# Patient Record
Sex: Female | Born: 1935 | Race: White | Hispanic: No | Marital: Married | Smoking: Never smoker
Health system: Southern US, Community
[De-identification: ages and names within clinical notes are randomized; demographics above are authoritative.]

## PROBLEM LIST (undated history)

## (undated) DIAGNOSIS — C259 Malignant neoplasm of pancreas, unspecified: Secondary | ICD-10-CM

---

## 2005-07-03 ENCOUNTER — Ambulatory Visit: Payer: Self-pay | Admitting: Family Medicine

## 2005-08-23 ENCOUNTER — Ambulatory Visit: Payer: Self-pay | Admitting: Surgery

## 2005-12-03 ENCOUNTER — Ambulatory Visit: Payer: Self-pay | Admitting: Unknown Physician Specialty

## 2006-07-16 ENCOUNTER — Ambulatory Visit: Payer: Self-pay | Admitting: Family Medicine

## 2007-07-23 ENCOUNTER — Ambulatory Visit: Payer: Self-pay | Admitting: Family Medicine

## 2009-10-11 ENCOUNTER — Ambulatory Visit: Payer: Self-pay | Admitting: Ophthalmology

## 2009-11-30 ENCOUNTER — Ambulatory Visit: Payer: Self-pay | Admitting: Ophthalmology

## 2010-01-18 ENCOUNTER — Inpatient Hospital Stay: Payer: Self-pay | Admitting: Internal Medicine

## 2010-01-20 ENCOUNTER — Encounter: Payer: Self-pay | Admitting: Gastroenterology

## 2010-01-24 ENCOUNTER — Telehealth (INDEPENDENT_AMBULATORY_CARE_PROVIDER_SITE_OTHER): Payer: Self-pay | Admitting: *Deleted

## 2010-01-24 ENCOUNTER — Encounter (INDEPENDENT_AMBULATORY_CARE_PROVIDER_SITE_OTHER): Payer: Self-pay | Admitting: *Deleted

## 2010-01-24 DIAGNOSIS — R933 Abnormal findings on diagnostic imaging of other parts of digestive tract: Secondary | ICD-10-CM

## 2010-01-25 ENCOUNTER — Telehealth: Payer: Self-pay | Admitting: Gastroenterology

## 2010-01-26 ENCOUNTER — Ambulatory Visit: Payer: Self-pay | Admitting: Gastroenterology

## 2010-01-26 ENCOUNTER — Ambulatory Visit (HOSPITAL_COMMUNITY): Admission: RE | Admit: 2010-01-26 | Discharge: 2010-01-26 | Payer: Self-pay | Admitting: Gastroenterology

## 2010-02-01 ENCOUNTER — Telehealth: Payer: Self-pay | Admitting: Gastroenterology

## 2010-02-01 ENCOUNTER — Telehealth (INDEPENDENT_AMBULATORY_CARE_PROVIDER_SITE_OTHER): Payer: Self-pay | Admitting: *Deleted

## 2010-02-01 ENCOUNTER — Encounter (INDEPENDENT_AMBULATORY_CARE_PROVIDER_SITE_OTHER): Payer: Self-pay | Admitting: *Deleted

## 2010-02-01 ENCOUNTER — Ambulatory Visit: Payer: Self-pay | Admitting: Gastroenterology

## 2010-02-06 ENCOUNTER — Encounter: Payer: Self-pay | Admitting: Gastroenterology

## 2010-02-06 ENCOUNTER — Ambulatory Visit (HOSPITAL_COMMUNITY): Admission: RE | Admit: 2010-02-06 | Discharge: 2010-02-06 | Payer: Self-pay | Admitting: Gastroenterology

## 2010-02-08 ENCOUNTER — Telehealth (INDEPENDENT_AMBULATORY_CARE_PROVIDER_SITE_OTHER): Payer: Self-pay | Admitting: *Deleted

## 2010-02-17 ENCOUNTER — Inpatient Hospital Stay (HOSPITAL_COMMUNITY): Admission: EM | Admit: 2010-02-17 | Discharge: 2010-02-22 | Payer: Self-pay | Admitting: Emergency Medicine

## 2010-02-17 ENCOUNTER — Encounter (INDEPENDENT_AMBULATORY_CARE_PROVIDER_SITE_OTHER): Payer: Self-pay | Admitting: *Deleted

## 2010-02-17 ENCOUNTER — Telehealth: Payer: Self-pay | Admitting: Gastroenterology

## 2010-02-18 ENCOUNTER — Encounter (INDEPENDENT_AMBULATORY_CARE_PROVIDER_SITE_OTHER): Payer: Self-pay | Admitting: *Deleted

## 2010-02-20 ENCOUNTER — Ambulatory Visit: Payer: Self-pay | Admitting: Internal Medicine

## 2010-02-21 ENCOUNTER — Ambulatory Visit: Payer: Self-pay | Admitting: Infectious Diseases

## 2010-02-24 ENCOUNTER — Ambulatory Visit: Payer: Self-pay | Admitting: Internal Medicine

## 2010-02-24 ENCOUNTER — Encounter: Payer: Self-pay | Admitting: Gastroenterology

## 2010-03-02 ENCOUNTER — Ambulatory Visit (HOSPITAL_COMMUNITY): Admission: RE | Admit: 2010-03-02 | Discharge: 2010-03-02 | Payer: Self-pay | Admitting: General Surgery

## 2010-03-05 ENCOUNTER — Ambulatory Visit: Payer: Self-pay | Admitting: Family Medicine

## 2010-03-15 ENCOUNTER — Ambulatory Visit (HOSPITAL_COMMUNITY): Admission: RE | Admit: 2010-03-15 | Discharge: 2010-03-15 | Payer: Self-pay | Admitting: General Surgery

## 2010-03-28 ENCOUNTER — Ambulatory Visit: Payer: Self-pay | Admitting: Gastroenterology

## 2010-03-29 ENCOUNTER — Encounter (INDEPENDENT_AMBULATORY_CARE_PROVIDER_SITE_OTHER): Payer: Self-pay | Admitting: *Deleted

## 2010-03-29 ENCOUNTER — Encounter: Payer: Self-pay | Admitting: Gastroenterology

## 2010-03-29 ENCOUNTER — Telehealth: Payer: Self-pay | Admitting: Gastroenterology

## 2010-03-29 DIAGNOSIS — Z862 Personal history of diseases of the blood and blood-forming organs and certain disorders involving the immune mechanism: Secondary | ICD-10-CM

## 2010-03-29 DIAGNOSIS — Z8639 Personal history of other endocrine, nutritional and metabolic disease: Secondary | ICD-10-CM

## 2010-03-30 ENCOUNTER — Ambulatory Visit (HOSPITAL_COMMUNITY): Admission: RE | Admit: 2010-03-30 | Discharge: 2010-03-30 | Payer: Self-pay | Admitting: Gastroenterology

## 2010-03-30 ENCOUNTER — Ambulatory Visit: Payer: Self-pay | Admitting: Gastroenterology

## 2010-04-03 ENCOUNTER — Encounter: Payer: Self-pay | Admitting: Gastroenterology

## 2010-04-03 ENCOUNTER — Encounter: Payer: Self-pay | Admitting: Nurse Practitioner

## 2010-04-04 ENCOUNTER — Telehealth: Payer: Self-pay | Admitting: Gastroenterology

## 2010-04-07 ENCOUNTER — Encounter: Payer: Self-pay | Admitting: Gastroenterology

## 2010-04-07 ENCOUNTER — Telehealth: Payer: Self-pay | Admitting: Gastroenterology

## 2010-04-27 ENCOUNTER — Encounter: Payer: Self-pay | Admitting: Gastroenterology

## 2010-05-16 ENCOUNTER — Ambulatory Visit: Payer: Self-pay | Admitting: Oncology

## 2010-05-16 LAB — CBC WITH DIFFERENTIAL (CANCER CENTER ONLY)
BASO#: 0 10*3/uL (ref 0.0–0.2)
Eosinophils Absolute: 0.3 10*3/uL (ref 0.0–0.5)
HCT: 31.4 % — ABNORMAL LOW (ref 34.8–46.6)
HGB: 10.9 g/dL — ABNORMAL LOW (ref 11.6–15.9)
LYMPH#: 2.2 10*3/uL (ref 0.9–3.3)
MONO#: 0.5 10*3/uL (ref 0.1–0.9)
NEUT#: 3.6 10*3/uL (ref 1.5–6.5)
NEUT%: 54.8 % (ref 39.6–80.0)
RBC: 3.5 10*6/uL — ABNORMAL LOW (ref 3.70–5.32)

## 2010-05-16 LAB — CMP (CANCER CENTER ONLY)
AST: 25 U/L (ref 11–38)
Albumin: 3.4 g/dL (ref 3.3–5.5)
BUN, Bld: 11 mg/dL (ref 7–22)
CO2: 29 mEq/L (ref 18–33)
Calcium: 9.1 mg/dL (ref 8.0–10.3)
Chloride: 100 mEq/L (ref 98–108)
Glucose, Bld: 102 mg/dL (ref 73–118)
Potassium: 3.9 mEq/L (ref 3.3–4.7)

## 2010-05-16 LAB — CANCER ANTIGEN 19-9: CA 19-9: 25.1 U/mL (ref <35.0)

## 2010-05-17 ENCOUNTER — Ambulatory Visit: Admission: RE | Admit: 2010-05-17 | Discharge: 2010-08-04 | Payer: Self-pay | Admitting: Radiation Oncology

## 2010-05-22 ENCOUNTER — Ambulatory Visit: Payer: Self-pay | Admitting: Radiation Oncology

## 2010-05-24 LAB — CBC WITH DIFFERENTIAL (CANCER CENTER ONLY)
BASO%: 0.2 % (ref 0.0–2.0)
LYMPH%: 29.2 % (ref 14.0–48.0)
MCV: 89 fL (ref 81–101)
MONO#: 0.1 10*3/uL (ref 0.1–0.9)
MONO%: 2.1 % (ref 0.0–13.0)
NEUT#: 3.7 10*3/uL (ref 1.5–6.5)
Platelets: 193 10*3/uL (ref 145–400)
RDW: 12.1 % (ref 10.5–14.6)
WBC: 5.6 10*3/uL (ref 3.9–10.0)

## 2010-05-24 LAB — CMP (CANCER CENTER ONLY)
ALT(SGPT): 24 U/L (ref 10–47)
Alkaline Phosphatase: 110 U/L — ABNORMAL HIGH (ref 26–84)
CO2: 32 mEq/L (ref 18–33)
Sodium: 144 mEq/L (ref 128–145)
Total Bilirubin: 0.6 mg/dl (ref 0.20–1.60)
Total Protein: 6.4 g/dL (ref 6.4–8.1)

## 2010-05-25 ENCOUNTER — Ambulatory Visit: Payer: Self-pay | Admitting: Internal Medicine

## 2010-06-01 LAB — CBC WITH DIFFERENTIAL (CANCER CENTER ONLY)
BASO%: 0.4 % (ref 0.0–2.0)
LYMPH%: 32.2 % (ref 14.0–48.0)
MCH: 30.2 pg (ref 26.0–34.0)
MCV: 89 fL (ref 81–101)
MONO%: 10.3 % (ref 0.0–13.0)
Platelets: 81 10*3/uL — ABNORMAL LOW (ref 145–400)
RDW: 12.7 % (ref 10.5–14.6)

## 2010-06-01 LAB — CMP (CANCER CENTER ONLY)
ALT(SGPT): 28 U/L (ref 10–47)
Alkaline Phosphatase: 117 U/L — ABNORMAL HIGH (ref 26–84)
Sodium: 143 mEq/L (ref 128–145)
Total Bilirubin: 0.5 mg/dl (ref 0.20–1.60)
Total Protein: 6.6 g/dL (ref 6.4–8.1)

## 2010-06-06 LAB — CBC WITH DIFFERENTIAL (CANCER CENTER ONLY)
Eosinophils Absolute: 0.1 10*3/uL (ref 0.0–0.5)
LYMPH%: 35.4 % (ref 14.0–48.0)
MCH: 30.5 pg (ref 26.0–34.0)
MCV: 89 fL (ref 81–101)
MONO%: 8.2 % (ref 0.0–13.0)
NEUT%: 53.8 % (ref 39.6–80.0)
Platelets: 299 10*3/uL (ref 145–400)
RBC: 3.33 10*6/uL — ABNORMAL LOW (ref 3.70–5.32)
RDW: 12.7 % (ref 10.5–14.6)

## 2010-06-06 LAB — CMP (CANCER CENTER ONLY)
Alkaline Phosphatase: 102 U/L — ABNORMAL HIGH (ref 26–84)
BUN, Bld: 8 mg/dL (ref 7–22)
Creat: 0.6 mg/dl (ref 0.6–1.2)
Glucose, Bld: 108 mg/dL (ref 73–118)
Sodium: 140 mEq/L (ref 128–145)
Total Bilirubin: 0.5 mg/dl (ref 0.20–1.60)
Total Protein: 6.3 g/dL — ABNORMAL LOW (ref 6.4–8.1)

## 2010-06-13 LAB — BASIC METABOLIC PANEL - CANCER CENTER ONLY
BUN, Bld: 13 mg/dL (ref 7–22)
CO2: 29 mEq/L (ref 18–33)
Calcium: 8.7 mg/dL (ref 8.0–10.3)
Creat: 0.7 mg/dl (ref 0.6–1.2)
Glucose, Bld: 224 mg/dL — ABNORMAL HIGH (ref 73–118)

## 2010-06-13 LAB — MANUAL DIFFERENTIAL (CHCC SATELLITE)
BASO: 1 % (ref 0–2)
Eos: 11 % — ABNORMAL HIGH (ref 0–7)
MONO: 9 % (ref 0–13)
PLT EST ~~LOC~~: INCREASED

## 2010-06-13 LAB — CBC WITH DIFFERENTIAL (CANCER CENTER ONLY)
HCT: 30.9 % — ABNORMAL LOW (ref 34.8–46.6)
HGB: 10.5 g/dL — ABNORMAL LOW (ref 11.6–15.9)
MCH: 30.7 pg (ref 26.0–34.0)
MCV: 91 fL (ref 81–101)
RBC: 3.41 10*6/uL — ABNORMAL LOW (ref 3.70–5.32)

## 2010-06-14 ENCOUNTER — Ambulatory Visit: Payer: Self-pay | Admitting: Oncology

## 2010-06-20 LAB — CBC WITH DIFFERENTIAL (CANCER CENTER ONLY)
BASO%: 0.8 % (ref 0.0–2.0)
LYMPH#: 0.7 10*3/uL — ABNORMAL LOW (ref 0.9–3.3)
LYMPH%: 9 % — ABNORMAL LOW (ref 14.0–48.0)
MCV: 92 fL (ref 81–101)
MONO#: 1 10*3/uL — ABNORMAL HIGH (ref 0.1–0.9)
Platelets: 203 10*3/uL (ref 145–400)
RDW: 13.8 % (ref 10.5–14.6)
WBC: 8 10*3/uL (ref 3.9–10.0)

## 2010-06-20 LAB — BASIC METABOLIC PANEL - CANCER CENTER ONLY
CO2: 30 mEq/L (ref 18–33)
Calcium: 9 mg/dL (ref 8.0–10.3)
Sodium: 138 mEq/L (ref 128–145)

## 2010-06-27 LAB — CBC WITH DIFFERENTIAL (CANCER CENTER ONLY)
BASO#: 0 10*3/uL (ref 0.0–0.2)
Eosinophils Absolute: 0.3 10*3/uL (ref 0.0–0.5)
HCT: 31.1 % — ABNORMAL LOW (ref 34.8–46.6)
HGB: 10.5 g/dL — ABNORMAL LOW (ref 11.6–15.9)
LYMPH#: 0.6 10*3/uL — ABNORMAL LOW (ref 0.9–3.3)
MCHC: 33.9 g/dL (ref 32.0–36.0)
MONO#: 0.6 10*3/uL (ref 0.1–0.9)
NEUT%: 68.7 % (ref 39.6–80.0)

## 2010-06-27 LAB — CMP (CANCER CENTER ONLY)
BUN, Bld: 8 mg/dL (ref 7–22)
CO2: 32 mEq/L (ref 18–33)
Calcium: 9.1 mg/dL (ref 8.0–10.3)
Chloride: 99 mEq/L (ref 98–108)
Creat: 0.6 mg/dl (ref 0.6–1.2)
Glucose, Bld: 245 mg/dL — ABNORMAL HIGH (ref 73–118)

## 2010-07-04 LAB — CMP (CANCER CENTER ONLY)
ALT(SGPT): 111 U/L — ABNORMAL HIGH (ref 10–47)
Albumin: 3 g/dL — ABNORMAL LOW (ref 3.3–5.5)
BUN, Bld: 10 mg/dL (ref 7–22)
CO2: 29 mEq/L (ref 18–33)
Calcium: 8.8 mg/dL (ref 8.0–10.3)
Chloride: 95 mEq/L — ABNORMAL LOW (ref 98–108)
Creat: 0.6 mg/dl (ref 0.6–1.2)
Potassium: 3.8 mEq/L (ref 3.3–4.7)

## 2010-07-04 LAB — CBC WITH DIFFERENTIAL (CANCER CENTER ONLY)
BASO#: 0 10*3/uL (ref 0.0–0.2)
BASO%: 0.3 % (ref 0.0–2.0)
HCT: 31.6 % — ABNORMAL LOW (ref 34.8–46.6)
HGB: 10.6 g/dL — ABNORMAL LOW (ref 11.6–15.9)
LYMPH#: 0.3 10*3/uL — ABNORMAL LOW (ref 0.9–3.3)
MONO#: 0.4 10*3/uL (ref 0.1–0.9)
NEUT#: 8.5 10*3/uL — ABNORMAL HIGH (ref 1.5–6.5)
NEUT%: 91 % — ABNORMAL HIGH (ref 39.6–80.0)
WBC: 9.3 10*3/uL (ref 3.9–10.0)

## 2010-07-05 ENCOUNTER — Ambulatory Visit (HOSPITAL_COMMUNITY): Admission: RE | Admit: 2010-07-05 | Discharge: 2010-07-05 | Payer: Self-pay | Admitting: Oncology

## 2010-07-05 LAB — GAMMA GT: GGT: 284 U/L — ABNORMAL HIGH (ref 7–51)

## 2010-07-07 ENCOUNTER — Ambulatory Visit (HOSPITAL_COMMUNITY): Admission: RE | Admit: 2010-07-07 | Discharge: 2010-07-07 | Payer: Self-pay | Admitting: Oncology

## 2010-07-07 ENCOUNTER — Encounter: Payer: Self-pay | Admitting: Gastroenterology

## 2010-07-07 LAB — CBC WITH DIFFERENTIAL (CANCER CENTER ONLY)
BASO%: 0.3 % (ref 0.0–2.0)
EOS%: 8.4 % — ABNORMAL HIGH (ref 0.0–7.0)
HCT: 34.7 % — ABNORMAL LOW (ref 34.8–46.6)
LYMPH%: 7.3 % — ABNORMAL LOW (ref 14.0–48.0)
MCHC: 33.1 g/dL (ref 32.0–36.0)
MCV: 95 fL (ref 81–101)
MONO#: 0.4 10*3/uL (ref 0.1–0.9)
MONO%: 7.1 % (ref 0.0–13.0)
NEUT%: 76.9 % (ref 39.6–80.0)
Platelets: 264 10*3/uL (ref 145–400)
RDW: 16.3 % — ABNORMAL HIGH (ref 10.5–14.6)
WBC: 5.6 10*3/uL (ref 3.9–10.0)

## 2010-07-07 LAB — CMP (CANCER CENTER ONLY)
ALT(SGPT): 97 U/L — ABNORMAL HIGH (ref 10–47)
AST: 99 U/L — ABNORMAL HIGH (ref 11–38)
Alkaline Phosphatase: 598 U/L — ABNORMAL HIGH (ref 26–84)
Creat: 0.4 mg/dl — ABNORMAL LOW (ref 0.6–1.2)
Sodium: 141 mEq/L (ref 128–145)
Total Bilirubin: 0.7 mg/dl (ref 0.20–1.60)
Total Protein: 6.6 g/dL (ref 6.4–8.1)

## 2010-07-07 LAB — HEPATITIS B SURFACE ANTIBODY,QUALITATIVE: Hep B S Ab: NEGATIVE

## 2010-07-07 LAB — LIPID PANEL
LDL Cholesterol: 82 mg/dL (ref 0–99)
Total CHOL/HDL Ratio: 3.2 Ratio
VLDL: 12 mg/dL (ref 0–40)

## 2010-07-07 LAB — HEPATITIS B SURFACE ANTIGEN: Hepatitis B Surface Ag: NEGATIVE

## 2010-07-11 ENCOUNTER — Encounter: Payer: Self-pay | Admitting: Gastroenterology

## 2010-07-11 ENCOUNTER — Telehealth: Payer: Self-pay | Admitting: Gastroenterology

## 2010-07-11 LAB — CBC WITH DIFFERENTIAL (CANCER CENTER ONLY)
BASO#: 0 10*3/uL (ref 0.0–0.2)
EOS%: 7 % (ref 0.0–7.0)
Eosinophils Absolute: 0.3 10*3/uL (ref 0.0–0.5)
LYMPH%: 11.7 % — ABNORMAL LOW (ref 14.0–48.0)
MCH: 32.2 pg (ref 26.0–34.0)
MCHC: 34.5 g/dL (ref 32.0–36.0)
MCV: 93 fL (ref 81–101)
MONO%: 8.7 % (ref 0.0–13.0)
NEUT#: 2.9 10*3/uL (ref 1.5–6.5)
Platelets: 213 10*3/uL (ref 145–400)
RBC: 3.31 10*6/uL — ABNORMAL LOW (ref 3.70–5.32)

## 2010-07-11 LAB — CMP (CANCER CENTER ONLY)
AST: 133 U/L — ABNORMAL HIGH (ref 11–38)
Alkaline Phosphatase: 584 U/L — ABNORMAL HIGH (ref 26–84)
BUN, Bld: 8 mg/dL (ref 7–22)
Glucose, Bld: 193 mg/dL — ABNORMAL HIGH (ref 73–118)
Potassium: 3.3 mEq/L (ref 3.3–4.7)
Sodium: 139 mEq/L (ref 128–145)
Total Bilirubin: 0.6 mg/dl (ref 0.20–1.60)

## 2010-07-12 LAB — HEPATITIS A ANTIBODY, IGM: Hep A IgM: NEGATIVE

## 2010-07-12 LAB — ANA: Anti Nuclear Antibody(ANA): NEGATIVE

## 2010-07-13 ENCOUNTER — Telehealth (INDEPENDENT_AMBULATORY_CARE_PROVIDER_SITE_OTHER): Payer: Self-pay | Admitting: *Deleted

## 2010-07-17 ENCOUNTER — Ambulatory Visit: Payer: Self-pay | Admitting: Gastroenterology

## 2010-07-17 LAB — CONVERTED CEMR LAB
AST: 120 units/L — ABNORMAL HIGH (ref 0–37)
Alkaline Phosphatase: 572 units/L — ABNORMAL HIGH (ref 39–117)
Total Bilirubin: 0.7 mg/dL (ref 0.3–1.2)
Total Protein: 6 g/dL (ref 6.0–8.3)

## 2010-07-18 ENCOUNTER — Ambulatory Visit: Payer: Self-pay | Admitting: Gastroenterology

## 2010-07-25 ENCOUNTER — Ambulatory Visit: Payer: Self-pay | Admitting: Oncology

## 2010-07-25 ENCOUNTER — Telehealth: Payer: Self-pay | Admitting: Gastroenterology

## 2010-07-25 ENCOUNTER — Encounter: Payer: Self-pay | Admitting: Gastroenterology

## 2010-08-03 ENCOUNTER — Encounter: Payer: Self-pay | Admitting: Gastroenterology

## 2010-08-03 LAB — CBC WITH DIFFERENTIAL (CANCER CENTER ONLY)
BASO#: 0 10*3/uL (ref 0.0–0.2)
BASO%: 0.4 % (ref 0.0–2.0)
HCT: 35.3 % (ref 34.8–46.6)
HGB: 12.1 g/dL (ref 11.6–15.9)
MCV: 94 fL (ref 81–101)
NEUT#: 2.2 10*3/uL (ref 1.5–6.5)
NEUT%: 75 % (ref 39.6–80.0)
Platelets: 193 10*3/uL (ref 145–400)
WBC: 2.9 10*3/uL — ABNORMAL LOW (ref 3.9–10.0)

## 2010-08-03 LAB — CMP (CANCER CENTER ONLY)
ALT(SGPT): 90 U/L — ABNORMAL HIGH (ref 10–47)
AST: 148 U/L — ABNORMAL HIGH (ref 11–38)
Albumin: 2.9 g/dL — ABNORMAL LOW (ref 3.3–5.5)
BUN, Bld: 8 mg/dL (ref 7–22)
CO2: 33 mEq/L (ref 18–33)
Potassium: 2.7 mEq/L — CL (ref 3.3–4.7)
Total Bilirubin: 1 mg/dl (ref 0.20–1.60)
Total Protein: 6.3 g/dL — ABNORMAL LOW (ref 6.4–8.1)

## 2010-08-03 LAB — HEPATIC FUNCTION PANEL
AST: 143 U/L — ABNORMAL HIGH (ref 0–37)
Bilirubin, Direct: 0.3 mg/dL (ref 0.0–0.3)
Total Protein: 6 g/dL (ref 6.0–8.3)

## 2010-08-07 ENCOUNTER — Encounter: Payer: Self-pay | Admitting: Gastroenterology

## 2010-08-14 ENCOUNTER — Encounter: Payer: Self-pay | Admitting: Gastroenterology

## 2010-08-16 ENCOUNTER — Encounter: Payer: Self-pay | Admitting: Gastroenterology

## 2010-08-22 ENCOUNTER — Ambulatory Visit: Payer: Self-pay | Admitting: Unknown Physician Specialty

## 2010-08-23 ENCOUNTER — Encounter: Payer: Self-pay | Admitting: Gastroenterology

## 2010-08-23 LAB — CBC WITH DIFFERENTIAL (CANCER CENTER ONLY)
BASO#: 0 10*3/uL (ref 0.0–0.2)
EOS%: 1.3 % (ref 0.0–7.0)
LYMPH%: 19.5 % (ref 14.0–48.0)
MCH: 32.1 pg (ref 26.0–34.0)
MCHC: 33.9 g/dL (ref 32.0–36.0)
MCV: 95 fL (ref 81–101)
MONO%: 10.4 % (ref 0.0–13.0)
NEUT#: 2.9 10*3/uL (ref 1.5–6.5)
Platelets: 174 10*3/uL (ref 145–400)

## 2010-08-23 LAB — CMP (CANCER CENTER ONLY)
AST: 66 U/L — ABNORMAL HIGH (ref 11–38)
Albumin: 3.4 g/dL (ref 3.3–5.5)
Alkaline Phosphatase: 375 U/L — ABNORMAL HIGH (ref 26–84)
BUN, Bld: 11 mg/dL (ref 7–22)
Glucose, Bld: 106 mg/dL (ref 73–118)
Potassium: 4.5 mEq/L (ref 3.3–4.7)
Sodium: 141 mEq/L (ref 128–145)
Total Bilirubin: 0.7 mg/dl (ref 0.20–1.60)

## 2010-09-02 ENCOUNTER — Encounter: Payer: Self-pay | Admitting: Gastroenterology

## 2010-09-11 ENCOUNTER — Ambulatory Visit: Payer: Self-pay | Admitting: Oncology

## 2010-09-13 ENCOUNTER — Encounter: Payer: Self-pay | Admitting: Gastroenterology

## 2010-09-13 LAB — COMPREHENSIVE METABOLIC PANEL
AST: 94 U/L — ABNORMAL HIGH (ref 0–37)
Albumin: 3.4 g/dL — ABNORMAL LOW (ref 3.5–5.2)
Alkaline Phosphatase: 414 U/L — ABNORMAL HIGH (ref 39–117)
BUN: 9 mg/dL (ref 6–23)
Creatinine, Ser: 0.63 mg/dL (ref 0.40–1.20)
Glucose, Bld: 148 mg/dL — ABNORMAL HIGH (ref 70–99)
Potassium: 3.7 mEq/L (ref 3.5–5.3)
Total Bilirubin: 0.6 mg/dL (ref 0.3–1.2)

## 2010-09-13 LAB — CBC WITH DIFFERENTIAL/PLATELET
Basophils Absolute: 0 10*3/uL (ref 0.0–0.1)
EOS%: 1.1 % (ref 0.0–7.0)
Eosinophils Absolute: 0 10*3/uL (ref 0.0–0.5)
HCT: 37 % (ref 34.8–46.6)
HGB: 12.7 g/dL (ref 11.6–15.9)
LYMPH%: 20.8 % (ref 14.0–49.7)
MCH: 33.1 pg (ref 25.1–34.0)
MCV: 96 fL (ref 79.5–101.0)
MONO%: 10.4 % (ref 0.0–14.0)
NEUT#: 2.8 10*3/uL (ref 1.5–6.5)
NEUT%: 67 % (ref 38.4–76.8)
Platelets: 185 10*3/uL (ref 145–400)
RDW: 14.3 % (ref 11.2–14.5)

## 2010-09-14 ENCOUNTER — Ambulatory Visit: Payer: Self-pay | Admitting: Unknown Physician Specialty

## 2010-09-14 ENCOUNTER — Telehealth: Payer: Self-pay | Admitting: Gastroenterology

## 2010-10-15 ENCOUNTER — Ambulatory Visit: Payer: Self-pay | Admitting: Unknown Physician Specialty

## 2010-10-19 ENCOUNTER — Encounter: Payer: Self-pay | Admitting: Gastroenterology

## 2010-10-19 ENCOUNTER — Ambulatory Visit: Payer: Self-pay | Admitting: Oncology

## 2010-10-19 LAB — CBC WITH DIFFERENTIAL/PLATELET
BASO%: 0.2 % (ref 0.0–2.0)
Basophils Absolute: 0 10*3/uL (ref 0.0–0.1)
EOS%: 4.2 % (ref 0.0–7.0)
Eosinophils Absolute: 0.2 10*3/uL (ref 0.0–0.5)
HCT: 36.2 % (ref 34.8–46.6)
HGB: 12.1 g/dL (ref 11.6–15.9)
LYMPH%: 20.6 % (ref 14.0–49.7)
MCH: 31.3 pg (ref 25.1–34.0)
MCHC: 33.4 g/dL (ref 31.5–36.0)
MCV: 93.8 fL (ref 79.5–101.0)
MONO#: 0.3 10*3/uL (ref 0.1–0.9)
MONO%: 8.4 % (ref 0.0–14.0)
NEUT#: 2.7 10*3/uL (ref 1.5–6.5)
NEUT%: 66.6 % (ref 38.4–76.8)
Platelets: 156 10*3/uL (ref 145–400)
RBC: 3.86 10*6/uL (ref 3.70–5.45)
RDW: 14.3 % (ref 11.2–14.5)
WBC: 4 10*3/uL (ref 3.9–10.3)
lymph#: 0.8 10*3/uL — ABNORMAL LOW (ref 0.9–3.3)
nRBC: 0 % (ref 0–0)

## 2010-10-19 LAB — CMP AND LIVER
ALT: 27 U/L (ref 0–35)
AST: 38 U/L — ABNORMAL HIGH (ref 0–37)
Albumin: 3.2 g/dL — ABNORMAL LOW (ref 3.5–5.2)
Alkaline Phosphatase: 227 U/L — ABNORMAL HIGH (ref 39–117)
BUN: 8 mg/dL (ref 6–23)
Bilirubin, Direct: 0.2 mg/dL (ref 0.0–0.3)
CO2: 28 mEq/L (ref 19–32)
Calcium: 8.5 mg/dL (ref 8.4–10.5)
Chloride: 105 mEq/L (ref 96–112)
Creatinine, Ser: 0.7 mg/dL (ref 0.40–1.20)
Glucose, Bld: 175 mg/dL — ABNORMAL HIGH (ref 70–99)
Indirect Bilirubin: 0.3 mg/dL (ref 0.0–0.9)
Potassium: 3.3 mEq/L — ABNORMAL LOW (ref 3.5–5.3)
Sodium: 144 mEq/L (ref 135–145)
Total Bilirubin: 0.5 mg/dL (ref 0.3–1.2)
Total Protein: 6 g/dL (ref 6.0–8.3)

## 2010-10-19 LAB — CANCER ANTIGEN 19-9: CA 19-9: 481.5 U/mL — ABNORMAL HIGH (ref <35.0)

## 2010-10-26 ENCOUNTER — Ambulatory Visit (HOSPITAL_COMMUNITY)
Admission: RE | Admit: 2010-10-26 | Discharge: 2010-10-26 | Payer: Self-pay | Source: Home / Self Care | Attending: Oncology | Admitting: Oncology

## 2010-10-31 ENCOUNTER — Ambulatory Visit (HOSPITAL_COMMUNITY)
Admission: RE | Admit: 2010-10-31 | Discharge: 2010-10-31 | Payer: Self-pay | Source: Home / Self Care | Attending: Oncology | Admitting: Oncology

## 2010-11-07 ENCOUNTER — Ambulatory Visit (HOSPITAL_COMMUNITY)
Admission: RE | Admit: 2010-11-07 | Discharge: 2010-11-07 | Payer: Self-pay | Source: Home / Self Care | Attending: Oncology | Admitting: Oncology

## 2010-11-08 LAB — CBC
HCT: 36.8 % (ref 36.0–46.0)
Hemoglobin: 12.6 g/dL (ref 12.0–15.0)
MCHC: 34.2 g/dL (ref 30.0–36.0)
MCV: 93.6 fL (ref 78.0–100.0)

## 2010-11-08 LAB — PROTIME-INR: Prothrombin Time: 14.4 seconds (ref 11.6–15.2)

## 2010-11-09 ENCOUNTER — Other Ambulatory Visit: Payer: Self-pay | Admitting: Oncology

## 2010-11-09 DIAGNOSIS — R160 Hepatomegaly, not elsewhere classified: Secondary | ICD-10-CM

## 2010-11-14 ENCOUNTER — Encounter
Admission: RE | Admit: 2010-11-14 | Discharge: 2010-11-14 | Payer: Self-pay | Source: Home / Self Care | Attending: Oncology | Admitting: Oncology

## 2010-11-14 NOTE — Letter (Signed)
Summary: Diabetic Instructions  [facility]  [address]   [place], [place] [address]   Phone: [phone]  Fax: [phone]    Kamarri Gutzmer 07/05/1931 MRN: [id number]   X   ORAL DIABETIC MEDICATION INSTRUCTIONS  The day before your procedure:   Take your diabetic pill as you do normally  The day of your procedure:   Do not take your diabetic pill    We will check your blood sugar levels during the admission process and again in Recovery before discharging you home  ________________________________________________________________________  

## 2010-11-14 NOTE — Letter (Signed)
Summary: [facility]  [facility]   Imported By: Sherian Rein 09/14/2010 14:37:53  _____________________________________________________________________  External Attachment:    Type:   Image     Comment:   External Document

## 2010-11-14 NOTE — Letter (Signed)
Summary: [facility]  [facility]   Imported By: Lennie Odor 08/25/2010 15:18:20  _____________________________________________________________________  External Attachment:    Type:   Image     Comment:   External Document

## 2010-11-14 NOTE — Progress Notes (Signed)
Summary: Path results  Phone Note Other Incoming Call back at [phone]   Caller: Alonza Bogus  Daughter Summary of Call: Dr Christella Hartigan the pt's daughter was caliing for Path results.  She said you would be calling her today.  I see the results but they are unsigned.   Initial call taken by: Chales Abrahams CMA Duncan Dull),  February 08, 2010 1:14 PM  Follow-up for Phone Call        i spoke with daughter and son about diagnosis.  I recommended they sit down with surgeon to consider resection (imaging to date does not show that this is not resectable).  patty, can you please arrange referral but do not call patient until tomorrow afternoon (family wants some time to discuss it with her first). Follow-up by: Rachael Fee MD,  February 08, 2010 1:58 PM  Additional Follow-up for Phone Call Additional follow up Details #1::        records faxed to [facility] will call pt tomorrow. Chales Abrahams CMA Duncan Dull)  February 08, 2010 2:03 PM   Pt daughter Junious Dresser is aware of appt on 02/24/10 with Dr Donell Beers Additional Follow-up by: Chales Abrahams CMA Duncan Dull),  February 09, 2010 11:21 AM

## 2010-11-14 NOTE — Progress Notes (Signed)
Summary: speak to nurse  Phone Note Call from Patient Call back at [phone]   Caller: Daughter Stan Head Call For: Christella Hartigan Reason for Call: Talk to Nurse Summary of Call: Daughter wants to speak to nurse regarding her mothers blood work appt next week and regarding how her mother is feeling. Initial call taken by: Tawni Levy,  April 04, 2010 8:22 AM  Follow-up for Phone Call        Fax sent to Dr.Mmoffett that labs need done at the end of this week not next week.Daughter says her mother is very anxious about her surgery and asking about anti-anxiety med.Advised to discuss this with Dr.Moffett tas she all ready has a call into him as her mom's bs. was very high yesterday. Follow-up by: Teryl Lucy RN,  April 04, 2010 11:20 AM

## 2010-11-14 NOTE — Progress Notes (Signed)
Summary: appt change  ---- Converted from flag ---- ---- 07/11/2010 3:00 PM, Rachael Fee MD wrote: that is ok to put her on the 4th  ---- 07/11/2010 1:33 PM, Chales Abrahams CMA (AAMA) wrote: Dr Christella Hartigan I scheduled the pt for 07/25/10. You are not in the office on Monday the 3rd and only in the office 1 1/2 days next week and already double booked.  Rose with the cancer center called and wants me to move her up to the 4th.  That will double book you twice is that ok?  ---- 07/11/2010 1:04 PM, Rachael Fee MD wrote: needs rov with me next week or week after at latest for elevated liver tests,  needs LFT checked the day prior ------------------------------

## 2010-11-14 NOTE — Progress Notes (Signed)
Summary: ERCP  Phone Note Outgoing Call Call back at [facility] Phone [phone]   Call placed by: Chales Abrahams CMA Duncan Dull),  February 01, 2010 2:50 PM Summary of Call: called and gave pt her instructions and reviewed meds.  she verbalized understanding.  She will await call from IR.  Also, called IR to inform them that DrJacobs has spoken with Madelaine Bhat at IR and wants a wire placed throught the internal and external biliary drain.  They should have pt complete by 10 30 and have the pt transported to ENDO at [facility].  left message on machine to call back [phone] Initial call taken by: Chales Abrahams CMA Duncan Dull),  February 01, 2010 2:50 PM  Follow-up for Phone Call        Spoke with IR they have the pt schedueld and will call the pt and have her instructed for their procedure. Follow-up by: Chales Abrahams CMA Duncan Dull),  February 01, 2010 3:20 PM

## 2010-11-14 NOTE — Letter (Signed)
Summary: Diabetic Instructions  [facility]  [address] [place], [place] [address]   Phone: [phone]  Fax: [phone]    [name] [birth year] MRN: [id number]   X   ORAL DIABETIC MEDICATION INSTRUCTIONS  The day before your procedure:   Take your diabetic pill as you do normally  The day of your procedure:   Do not take your diabetic pill    We will check your blood sugar levels during the admission process and again in Recovery before discharging you home  ________________________________________________________________________

## 2010-11-14 NOTE — Procedures (Signed)
Summary: Endoscopic Ultrasound  Patient: Kindall Swaby Note: All result statuses are Final unless otherwise noted.  Tests: (1) Endoscopic Ultrasound (EUS)  EUS Endoscopic Ultrasound                             DONE     [facility]     [address] [place], [place]  [address]           ENDOSCOPIC ULTRASOUND PROCEDURE REPORT           PATIENT:  Analee, Montee  MR#:  [id number]     BIRTHDATE:  16-Apr-1931  GENDER:  female     ENDOSCOPIST:  Rachael Fee, MD     REferred by:  Lutricia Feil, MD           PROCEDURE DATE:  01/26/2010     PROCEDURE:  Upper EUS w/FNA     ASA CLASS:  Class II     INDICATIONS:  recent painless jaundice; double duct sign on     outside CT scan; failed ERCP (unable to cannulate, Dr. Bluford Kaufmann),     eventual cholecystotomy drain placed which was then internalized     (ext/int drain) by IR in [place], no biliary brushings done that     I know of     MEDICATIONS:   Fentanyl 60 mcg IV, Versed 8 mg IV     DESCRIPTION OF PROCEDURE:   After the risks, benefits, and     alternatives of the procedure were thoroughly explained, informed     consent was obtained.  The  endoscope was introduced through the     and advanced to the .     <<PROCEDUREIMAGES>>           Endoscopic findings (limited views with radial and linear echo     endoscopes):     1. Normal esophagus     2. Normal stomach     3. Normal duodenum           EUS findings:     1. Pancreatic head was diffusly abnormal without clear, delineated     mass.  The head was heterogeneous, hypoechoic.  Given such vague     findings, I cannout assess vascular involvement if this indeed     represents maligancy.  The internal/external biliary drain passed     through head of pancreas, within the CBD.  Parechyma adjacent to     the biliary drain was sampled with 2 passes of a 22 gauge BS EUS     FNA needle and 3 passes with a 25 gauge BS EUS FNA needle. Color     doppler was used to avoid significant  vessles.     2. Main pancreatic duct was very dilated (6-32mm in head, dilating     up to 11mm in body, tail).     3. CBD was dilated, contained int/external biliary drain.     4. No peripancreatic adenopathy.     5. Pancreatic parenchyma in body, tail was very atrophic.     6. Limited views of liver, spleen, portal and splenic vessels were     all normal.     7. Biliary drain is within gallbladder fossa, there is extensive     sludge vs. soft tissue mass within GB and wall of GB was thickened     to 3mm difusely.  Impression:     Diffusely abnormal head of pancreas without clearly defined mass.     Dilated CBD and main pancreatic duct.  Preliminary cytology review     shows no clearly malignant cells. Await final cell block analysis.     If the final cytology is negative for malignancy, will likely     recommend biliary brushings (interventional radiology) through     existing internal/external drain.  I will communicate these     finding/recommendations.           ______________________________     Rachael Fee, MD           n.     eSIGNED:   Rachael Fee at 01/26/2010 12:17 PM           Charlyne Mom, [phone]  Note: An exclamation mark (!) indicates a result that was not dispersed into the flowsheet. Document Creation Date: 01/26/2010 12:18 PM _______________________________________________________________________  (1) Order result status: Final Collection or observation date-time: 01/26/2010 00:00 Requested date-time:  Receipt date-time:  Reported date-time:  Referring Physician:   Ordering Physician: Rob Bunting [phone]) Specimen Source:  Source: Launa Grill Order Number: [id number] Lab site:   Appended Document: Endoscopic Ultrasound daughter Junious Dresser phone # [phone] (call her first), pt ok with this

## 2010-11-14 NOTE — Assessment & Plan Note (Signed)
Review of gastrointestinal problems: 1. Pancreatico-biliary adenocarcinoma. Presented with painless jaundice outside facility. Failed ERCP elsewhere. Underwent percutaneous drainage. Endoscopic ultrasound April 2011 showed abnormal head of pancreas without clear defined mass. FNA showed no malignant cells. [facility] ERCP April 2011 with biliary brushings confirmed adenocarcinoma in her bile duct. There was a 2-3 cm long stricture, stented with a 10 French, 7 cm long biliary stent, removable.  May 2000 11 subcutaneous, perihepatic abscess at site of previous percutaneous biliary drain. This was drained by interventional radiology. Diagnostic laparoscopy June 2011 showed no clear evidence of metastatic disease, Peritoneal washings were negative for malignancy. PET Scan showed no clear evidence of metastatic disease.   lap whipple at [facility] 2011 (negative margins) but 2 of 36 nodes were positive for malignancy   History of Present Illness Visit Type: Follow-up Visit Primary GI MD: Rob Bunting MD Primary Provider: Deberah Pelton, MD Chief Complaint: abnormal liver function tests History of Present Illness:     very pleasant 77 yo woman  whom I last saw several months ago recently noted to have  elevatd liver tests.  alk phos 4-500, AST/ALT.  Was taking xeloda for about 3 weeks, one pill a day. This was stopped for rising liver tests.  also had gemcitabine and radiation therapy  Was getting xrt as well, held temporarily.  I reviewed her liver tests over the past 10 days, they show a very gradual decreasing trend.    Total Bilirubin           0.7 mg/dL                   1.6-1.0   Direct Bilirubin          0.2 mg/dL                   9.6-0.4   Alkaline Phosphatase [H]  572 U/L                     39-117   AST                  [H]  120 U/L                     0-37   ALT                  [H]  68 U/L                      0-35   Total Protein             6.0 g/dL                    5.4-0.9  Albumin              [L]  3.2 g/dL                    8.1-1.9           Current Medications (verified): 1)  Glipizide 5 Mg Tabs (Glipizide) .Marland Kitchen.. 1 By Mouth Once Daily Hold 2)  Loratadine 10 Mg Tabs (Loratadine) .Marland Kitchen.. 1 By Mouth Once Daily 3)  Omeprazole 20 Mg Cpdr (Omeprazole) .... Once Daily 4)  Klor-Con 20 Meq Pack (Potassium Chloride) .... Once Daily 5)  Aspirin 81 Mg Tbec (Aspirin) .... Once Daily 6)  Citracal/vitamin D 250-200 Mg-Unit Tabs (Calcium Citrate-Vitamin D) .... Once Daily 7)  Vitamin  D 2000iu .... Once Daily 8)  Fluticasone Propionate 50 Mcg/act Susp (Fluticasone Propionate) .Marland Kitchen.. 1 Puff Daily 9)  Novolog Mix 70/30 70-30 % Susp (Insulin Aspart Prot & Aspart) .... Sliding Scale 10)  Benadryl 25 Mg Tabs (Diphenhydramine Hcl) .... As Needed  Allergies (verified): 1)  ! Hydrocodone 2)  ! * Ivp Dye  Vital Signs:  Patient profile:   77 year old female Height:      62 inches Weight:      108.25 pounds BMI:     19.87 Pulse rate:   68 / minute Pulse rhythm:   regular BP sitting:   134 / 68  (left arm) Cuff size:   regular  Vitals Entered By: June McMurray CMA Duncan Dull) (July 18, 2010 1:20 PM)  Physical Exam  Additional Exam:  Constitutional: thin Psychiatric: alert and oriented times 3 Abdomen: soft, non-tender, non-distended, normal bowel sounds    Impression & Recommendations:  Problem # 1:  elevated liver tests, cholestasis I did not mention above but she did have an MR CP done one week ago and this showed mildly dilated bile ducts consistent with recent Whipple procedure. There were no obstructing stones. There was some porta hepatis haziness that may be inflammation related to surgery or perhaps from radiation. 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 This can take many weeks to resolve and the meantime I've recommended that she not resumes alone. I think it is probably safe for her to resume radiation therapy  if she chooses to.  She should have weekly LFTs checked and as long as they're showing a decreasing trend then no further GI workup needs to be done. She wants this to be done at Dr. Milta Deiters office and I think it is reasonable since she lives closer to their. I would like to know the results of his liver tests and certainly would like here if any dramatic increases in her liver tests occur.  Patient Instructions: 1)  Liver test elevation is probably from xeloda, this can take weeks to improve.  Would probably not restart xeloda in future.   Would check weekly LFTs and as long they continue to improve then no further workup is needed. 2)  A copy of this information will be sent to Dr. Lyna Poser. 3)  The medication list was reviewed and reconciled.  All changed / newly prescribed medications were explained.  A complete medication list was provided to the patient / caregiver.  Appended Document:  can you send to Hal Neer, MD (radiation/oncology)  Appended Document:  sen to transcription  Appended Document:  received faxed labs dated 07/25/10: ap 543, ast 79, alt 56.  these are all still slowling improving.  please call her.  should have repeat LFTs ([facility]) in 2 weeks and results faxed to me.  Appended Document:  pt states she is having labs at [facility] in the next couple weeks will have results sent over

## 2010-11-14 NOTE — Letter (Signed)
Summary: [facility]   Imported By: Sherian Rein 03/14/2010 09:37:08  _____________________________________________________________________  External Attachment:    Type:   Image     Comment:   External Document

## 2010-11-14 NOTE — Progress Notes (Signed)
Summary: Update  Phone Note Other Incoming   Caller: Dr. Lutricia Feil   [phone] Summary of Call: Update: Pt is going to be discharged from hosp. today. Family is requesting that she have a combined procedure placing a stint from the inside. He told pt.'s family he will leave that up to Dr. Christella Hartigan to be done in a week and a half if it needs to be done. Initial call taken by: Karna Christmas,  January 25, 2010 10:03 AM  Follow-up for Phone Call        she is already scheduled for the EUS procedure for tomorrow, will not be able to do a "double" procedure (a double procedure would take interventional radiology input, time, plus extra time on my schedule which is already full).  I can do the EUS only tomorrow. Follow-up by: Rachael Fee MD,  January 25, 2010 10:42 AM

## 2010-11-14 NOTE — Progress Notes (Signed)
Summary: Triage  Phone Note Call from Patient Call back at Home Phone [phone]   Caller: Patient Call For: Dr. Christella Hartigan Reason for Call: Talk to Nurse Summary of Call: wants to know when she should come in for bloodwork....had bloodwork done yesterday at Dr. Santo Held office Initial call taken by: Leanor Kail [facility],  September 14, 2010 1:02 PM  Follow-up for Phone Call        labs just recieved, when Dr Christella Hartigan reviews I will call the pt and let her know what he advises.  She agreed Follow-up by: Chales Abrahams CMA (AAMA),  September 14, 2010 1:10 PM

## 2010-11-14 NOTE — Letter (Signed)
Summary: EGD Instructions  [facility]  [address] [place], [place] [address]   Phone: [phone]  Fax: [phone]       ALISA STJAMES    Apr 06, 1931    MRN: [id number]       Procedure Day /Date:01/26/10     Arrival Time: 830 am     Procedure Time:1000 am     Location of Procedure:                     [facility] ( Outpatient Registration)   PREPARATION FOR ENDOSCOPY   On 01/26/10 THE DAY OF THE PROCEDURE:  1.   No solid foods, milk or milk products are allowed after midnight the night before your procedure.  2.   Do not drink anything colored red or purple.  Avoid juices with pulp.  No orange juice.  3.  You may drink clear liquids until 6 am which is 4 hours before your procedure.                                                                                                CLEAR LIQUIDS INCLUDE: Water Jello Ice Popsicles Tea (sugar ok, no milk/cream) Powdered fruit flavored drinks Coffee (sugar ok, no milk/cream) Gatorade Juice: apple, white grape, white cranberry  Lemonade Clear bullion, consomm, broth Carbonated beverages (any kind) Strained chicken noodle soup Hard Candy   MEDICATION INSTRUCTIONS  Unless otherwise instructed, you should take regular prescription medications with a small sip of water as early as possible the morning of your procedure.  Diabetic patients - see separate instructions.    Additional medication instructions: no blood thinners for at least 24 hours prior to the procedure             OTHER INSTRUCTIONS  You will need a responsible adult at least 75 years of age to accompany you and drive you home.   This person must remain in the waiting room during your procedure.  Wear loose fitting clothing that is easily removed.  Leave jewelry and other valuables at home.  However, you may wish to bring a book to read or an iPod/MP3 player to listen to music as you wait for your procedure to  start.  Remove all body piercing jewelry and leave at home.  Total time from sign-in until discharge is approximately 2-3 hours.  You should go home directly after your procedure and rest.  You can resume normal activities the day after your procedure.  The day of your procedure you should not:   Drive   Make legal decisions   Operate machinery   Drink alcohol   Return to work  You will receive specific instructions about eating, activities and medications before you leave.    The above instructions have been reviewed and explained to me by   Chales Abrahams CMA Duncan Dull)  January 24, 2010 9:13 AM     I fully understand and can verbalize these instructions faxed to Anitra at [facility] she will give to the pt.  Date 01/24/10

## 2010-11-14 NOTE — Progress Notes (Signed)
Summary: sch appt?  Phone Note From Other Clinic Call back at [phone]   Caller: Okey Dupre, scheduler Call For: Dr. Christella Hartigan Summary of Call: Okey Dupre believes Dr. Drue Second would like pt to be seen by Dr. Christella Hartigan, but thinks Dr. Christella Hartigan may have already discussed this with Dr. Welton Flakes.Marland Kitchen.?? (nothing in EMR)  Rose asked to speak with Dr. Larae Grooms nurse Initial call taken by: Vallarie Mare,  July 11, 2010 1:18 PM  Follow-up for Phone Call        appt scheduled for 07/25/10 Labs the day prior Okey Dupre is aware and will notify pt. Follow-up by: Chales Abrahams CMA Duncan Dull),  July 11, 2010 1:28 PM

## 2010-11-14 NOTE — Letter (Signed)
Summary: Diabetic Instructions  [facility]  [address] [place], [place] [address]   Phone: [phone]  Fax: [phone]    [name] [birth year] MRN: [id number]   X   ORAL DIABETIC MEDICATION INSTRUCTIONS  The day before your procedure:   Take your diabetic pill as you do normally  The day of your procedure:   Do not take your diabetic pill    We will check your blood sugar levels during the admission process and again in Recovery before discharging you home  ________________________________________________________________________  X   INSULIN (LONG ACTING) MEDICATION INSTRUCTIONS (Lantus, NPH, 70/30, Humulin, Novolin-N)   The day before your procedure:   Take  your regular evening dose    The day of your procedure:   Do not take your morning dose    X   INSULIN (SHORT ACTING) MEDICATION INSTRUCTIONS (Regular, Humulog, Novolog)   The day before your procedure:   Do not take your evening dose   The day of your procedure:   Do not take your morning dose

## 2010-11-14 NOTE — Progress Notes (Signed)
Summary: Triage  Phone Note Call from Patient   Caller: Daughter Stan Head [phone] Call For: Dr. Christella Hartigan Reason for Call: Talk to Nurse Summary of Call: Once pt. was seen yesterday she started to run a high temp....103 at 5:15p.m. yesterday. It did come down overnight with ibuprofen Initial call taken by: Karna Christmas,  March 29, 2010 8:04 AM  Follow-up for Phone Call        temp is now 99.7  last Ibuprofen was at 4 am. She has no other symptoms.   She called her PCP and he advised her to call Dr Christella Hartigan.   Please Advise  Additional Follow-up for Phone Call Additional follow up Details #1::        she needs cbc, cmet checed today.  can have it done locally as long as results are sent to me as soon as they are ready. Additional Follow-up by: Rachael Fee MD,  March 29, 2010 8:19 AM    Additional Follow-up for Phone Call Additional follow up Details #2::    order sent to Dr Marguerite Olea [phone].  STAT.  Pt is aware Follow-up by: Chales Abrahams CMA Duncan Dull),  March 29, 2010 8:56 AM   Appended Document: Triage patty, please call her.  her LFTs are up (aphos 525, ast 84, alt 151, tbili 1.3) normal WBC.  Compared to last LFTs while in hosp these are definitely up.  With that, and the fever she had it is safest to start abx today (cipro 500mg  by mouth twice daily,  please give a 7 day course without refills) and change the stent (ERCP tomorrow at [facility] for stent exchange).  Appended Document: Triage    Clinical Lists Changes  Problems: Added new problem of LIVER FUNCTION TESTS, ABNORMAL, HX OF (ICD-V12.2) Medications: Added new medication of CIPRO 500 MG TABS (CIPROFLOXACIN HCL) 1 by mouth two times a day x 7 days - Signed Rx of CIPRO 500 MG TABS (CIPROFLOXACIN HCL) 1 by mouth two times a day x 7 days;  #14 x 0;  Signed;  Entered by: Chales Abrahams CMA (AAMA);  Authorized by: Rachael Fee MD;  Method used: Electronically to [facility] [address],  [place], [place], [place]  [address], Ph: [phone], Fax: [phone] Orders: Added new Test order of ERCP (ERCP) - Signed    Prescriptions: CIPRO 500 MG TABS (CIPROFLOXACIN HCL) 1 by mouth two times a day x 7 days  #14 x 0   Entered by:   Chales Abrahams CMA (AAMA)   Authorized by:   Rachael Fee MD   Signed by:   Chales Abrahams CMA (AAMA) on 03/29/2010   Method used:   Electronically to        [facility]  [address] (retail)       [address], [address]       [place], [place]  [address]       Ph: [phone]       Fax: [phone]   RxID:   [id number]    Appended Document: Triage pt aware and meds reviewed and instructed

## 2010-11-14 NOTE — Progress Notes (Signed)
Summary: Lab resulrs  Phone Note Call from Patient Call back at [phone]   Call For: Dr Christella Hartigan Reason for Call: Lab or Test Results Initial call taken by: Leanor [facility],  April 07, 2010 3:15 PM  Follow-up for Phone Call        Labs were drawn this am at Dr. Gaylene Brooks office but results have not been rec'd.Daughter will call there and ask them to fax to [place]. Follow-up by: Teryl Lucy RN,  April 07, 2010 4:02 PM     Appended Document: Lab resulrs received faxed lab results dated 6/24: CBC normal, LFTs much improved (nearly all normal).  please call her and let her know the new stent is functioning very well.  Appended Document: Lab resulrs pt aware

## 2010-11-14 NOTE — Letter (Signed)
Summary: [facility]  [facility]   Imported By: Sherian Rein 09/05/2010 14:23:52  _____________________________________________________________________  External Attachment:    Type:   Image     Comment:   External Document

## 2010-11-14 NOTE — Procedures (Signed)
Summary: ERCP  Patient: [name] Note: All result statuses are Final unless otherwise noted.  Tests: (1) ERCP (ERC)   ERC ERCP                  DONE     [facility]     [address] [place], [place]  [address]           ERCP PROCEDURE REPORT           PATIENT:  [name], [name]  MR#:  [id number]     BIRTHDATE:  1931-02-01  GENDER:  female     ENDOSCOPIST:  Rachael Fee, MD     PROCEDURE DATE:  03/30/2010     PROCEDURE:  ERCP with stent change     INDICATIONS:  known malignant biliary stricture; recent fever to     103, elevated liver tests suggesting stent occlusion (previous     plastic biliary stent placed 4/25)     MEDICATIONS:  Fentanyl 50 mcg IV, Versed 5 mg IV     TOPICAL ANESTHETIC:  none           DESCRIPTION OF PROCEDURE:   After the risks benefits and     alternatives of the procedure were thoroughly explained, informed     consent was obtained.  The  endoscope was introduced through the     mouth and advanced to the second portion of the duodenum without     detailed examination of the UGI tract. The previously placed     plastic biliary stent was extending 3-4cm into the duodenum across     major papilla (appeared to have migrated somewhat).  This was     removed with snare and then a wire was introduced into the CBD and     dye was injected. Cholangiogram revealed distal biliary stricture     extending up to 3-4cm from major papilla.  A new, fully covered     and removable 6cm long 10mm SEMS was placed across the stricture     in good position. There was delivery of somewhat turbid appearing     bile (not frankly purulent).  The main pancreatic duct was never     injected with dye or cannulated with wire.     <<PROCEDUREIMAGES>>     COMPLICATIONS:  None           ENDOSCOPIC IMPRESSION:     1) Previously placed 7cm long, 10Fr plastic biliary stent had     migrated 3-4cm into duodenum and that was likely the cause of her     fevers, elevated  liver tests (the stent was no longer bridging the     malignant CBD stricture effectively). The stent was removed and a     10mm diameter, 6cm long fully covered (removable) SEMS was placed     in good position.           RECOMMENDATIONS:     1) Contine cipro until completed (5 more days).     2) Dr. Christella Hartigan' office will arrange for repeat LFTs next week     (can be done locally and results faxed to Dr. Christella Hartigan).           ______________________________     Rachael Fee, MD           cc: Almond Lint, MD; Bedelia Person, MD           n.  eSIGNED:   Rachael Fee at 03/30/2010 12:21 PM           Charlyne Mom, [phone]  Note: An exclamation mark (!) indicates a result that was not dispersed into the flowsheet. Document Creation Date: 03/30/2010 12:22 PM _______________________________________________________________________  (1) Order result status: Final Collection or observation date-time: 03/30/2010 12:08 Requested date-time:  Receipt date-time:  Reported date-time:  Referring Physician:   Ordering Physician: Rob Bunting [phone]) Specimen Source:  Source: Launa Grill Order Number: [id number] Lab site:   Appended Document: ERCP patty,  she needs cmet, cbc drawn late next week.  can be done locally and results faxed to me.  thanks  Appended Document: ERCP Pt. ntfd. to get lab work next week.Order faxed to Dr. Marguerite Olea -her PCP

## 2010-11-14 NOTE — Miscellaneous (Signed)
Summary: Plan of Care & Treatment/[facility]  Plan of Care & Treatment/[facility]   Imported By: Sherian Rein 04/05/2010 14:34:34  _____________________________________________________________________  External Attachment:    Type:   Image     Comment:   External Document

## 2010-11-14 NOTE — Letter (Signed)
Summary: [facility]  [facility]   Imported By: Sherian Rein 05/19/2010 08:38:43  _____________________________________________________________________  External Attachment:    Type:   Image     Comment:   External Document

## 2010-11-14 NOTE — Progress Notes (Signed)
Summary: Triage  Phone Note From Other Clinic   Caller: [facility] @ Dr. Bluford Kaufmann  [phone] Call For: Dr. Christella Hartigan Summary of Call: Would like to discuss pt. She is not doing well today Initial call taken by: Karna Christmas,  February 01, 2010 10:58 AM  Follow-up for Phone Call        FYI:  pt not doing well  total bili 9.1  direct 5.1  bp is dropping.  Dr Marguerite Olea is her PCP and is sending her to [facility]. Chales Abrahams CMA Duncan Dull)  February 01, 2010 11:17 AM   Additional Follow-up for Phone Call Additional follow up Details #1::        I spoke with [facility], the IR placed drain is probably occluded.  I recommended that IR at [facility] check into this, the drain was placed by them. Additional Follow-up by: Rachael Fee MD,  February 01, 2010 11:25 AM     Appended Document: Triage Dr Christella Hartigan gave a verbal order to have the pt scheduled for an ERCP on Monday the 25th at 12:30pm with brushings and possible plastic stent.  [facility] procedure was set up with IR.

## 2010-11-14 NOTE — Assessment & Plan Note (Signed)
  Review of gastrointestinal problems: 1. Pancreatico-biliary adenocarcinoma. Presented with painless jaundice outside facility. Failed ERCP elsewhere. Underwent percutaneous drainage. Endoscopic ultrasound April 2011 showed abnormal head of pancreas without clear defined mass. FNA showed no malignant cells. [facility] ERCP April 2011 with biliary brushings confirmed adenocarcinoma in her bile duct. There was a 2-3 cm long stricture, stented with a 10 French, 7 cm long biliary stent, removable.  May 2000 11 subcutaneous, perihepatic abscess at site of previous percutaneous biliary drain. This was drained by interventional radiology. Diagnostic laparoscopy June 2011 showed no clear evidence of metastatic disease, Peritoneal washings were negative for malignancy. PET Scan showed no clear evidence of metastatic disease    History of Present Illness Visit Type: Follow-up Visit Primary GI MD: Rob Bunting MD Primary Provider: Deberah Pelton, MD Chief Complaint: pancreatitis History of Present Illness:   had diagnostic laparoscopy earlier this month, there was no clear sign of metastatic disease.   peritoneal washings also showed no malignant cells  she is scheduled for Whipple procedure with Dr. Donell Beers next month.  Her daughter has been in contact with Dr. Candelaria Celeste at [facility] and she is going up to [place] in 2 weeks for office visit.  The patient is concerned about having a surgery done so far away.                 Current Medications (verified): 1)  Glipizide 5 Mg Tabs (Glipizide) .Marland Kitchen.. 1 By Mouth Once Daily 2)  Loratadine 10 Mg Tabs (Loratadine) .Marland Kitchen.. 1 By Mouth Once Daily  Allergies (verified): 1)  ! Hydrocodone 2)  ! * Ivp Dye  Vital Signs:  Patient profile:   77 year old female Height:      62 inches Weight:      118 pounds BMI:     21.66 Pulse rate:   80 / minute Pulse rhythm:   regular BP sitting:   130 / 72  (left arm) Cuff size:   regular  Vitals  Entered By: June McMurray CMA Duncan Dull) (March 28, 2010 1:20 PM)  Physical Exam  Additional Exam:  Constitutional: generally well appearing Psychiatric: alert and oriented times 3 anicteric sclera Abdomen: soft, non-tender, non-distended, normal bowel sounds    Impression & Recommendations:  Problem # 1:  pancreatico- biliary adenocarcinoma no sign of metastatic disease on peritoneal washings or diagnostic laparoscopy or PET scan. She is already scheduled for a Whipple procedure however her daughter has looked up a Careers adviser at [facility] who she will be meeting with later this month to consider Whipple procedure done laparoscopically.  Patient Instructions: 1)  Follow up as needed with Dr. Christella Hartigan following surgery. 2)  A copy of this information will be sent to Dr. Suzie Portela. 3)  The medication list was reviewed and reconciled.  All changed / newly prescribed medications were explained.  A complete medication list was provided to the patient / caregiver.

## 2010-11-14 NOTE — Letter (Signed)
Summary: EGD Instructions  [facility]  [address] [place], [place] [address]   Phone: [phone]  Fax: [phone]       [name]    Feb 13, 1931    MRN: [id number]       Procedure Day /Date:02/06/10     Arrival Time: 1130 am     Procedure Time:1230 pm     Location of Procedure:                     [facility] ( Outpatient Registration)   PREPARATION FOR ENDOSCOPY   On 02/06/10  THE DAY OF THE PROCEDURE:  1.   No solid foods, milk or milk products are allowed after midnight the night before your procedure.  2.   Do not drink anything colored red or purple.  Avoid juices with pulp.  No orange juice.  3.  You may drink clear liquids until 830 am , which is 4 hours before your procedure.                                                                                                CLEAR LIQUIDS INCLUDE: Water Jello Ice Popsicles Tea (sugar ok, no milk/cream) Powdered fruit flavored drinks Coffee (sugar ok, no milk/cream) Gatorade Juice: apple, white grape, white cranberry  Lemonade Clear bullion, consomm, broth Carbonated beverages (any kind) Strained chicken noodle soup Hard Candy   MEDICATION INSTRUCTIONS  Unless otherwise instructed, you should take regular prescription medications with a small sip of water as early as possible the morning of your procedure.   You will need a responsible adult at least 75 years of age to accompany you and drive you home.   This person must remain in the waiting room during your procedure.  Wear loose fitting clothing that is easily removed.  Leave jewelry and other valuables at home.  However, you may wish to bring a book to read or an iPod/MP3 player to listen to music as you wait for your procedure to start.  Remove all body piercing jewelry and leave at home.  Total time from sign-in until discharge is approximately 2-3 hours.  You should go home directly after your procedure and rest.   You can resume normal activities the day after your procedure.  The day of your procedure you should not:   Drive   Make legal decisions   Operate machinery   Drink alcohol   Return to work  You will receive specific instructions about eating, activities and medications before you leave.    The above instructions have been reviewed and explained to me by   Chales Abrahams CMA Duncan Dull)  February 01, 2010 2:49 PM     I fully understand and can verbalize these instructions over the phone Date 02/01/10

## 2010-11-14 NOTE — Progress Notes (Signed)
Summary: triage  Phone Note Call from Patient Call back at [phone]   Caller: daughter, Junious Dresser Call For: Dr. Christella Hartigan Reason for Call: Talk to Nurse Summary of Call: pt having abd swelling around where she had a radiological procedure... daughter reports redness and a "knot" near site Initial call taken by: Vallarie Mare,  Feb 17, 2010 8:07 AM  Follow-up for Phone Call        Daughter noticed on Sunday when she checkesd her mother's incisional site a tiny knot and some slight reddness.Symptoms have progressed and their is now a 4 inch area of erythema and swelling. pt. statred complianing of pain at site and today says it is a pain level of 4.  No fever. Follow-up by: Teryl Lucy RN,  Feb 17, 2010 8:37 AM  Additional Follow-up for Phone Call Additional follow up Details #1::        she needs to go to local ER, seen by her interventional radiology team there, there may be an abscess at the site.   Additional Follow-up by: Rachael Fee MD,  Feb 17, 2010 8:40 AM    Additional Follow-up for Phone Call Additional follow up Details #2::    Daughter ntfd. of Dr.Jacobs recommemdations and she will take pt. to [facility] ER as procedure was done there. Follow-up by: Teryl Lucy RN,  Feb 17, 2010 9:04 AM

## 2010-11-14 NOTE — Procedures (Signed)
Summary: ERCP  Patient: [name] Note: All result statuses are Final unless otherwise noted.  Tests: (1) ERCP (ERC)   ERC ERCP                  DONE     [facility]     [address] [place], [place]  [address]           ERCP PROCEDURE REPORT           PATIENT:  [name], [name]  MR#:  [id number]     BIRTHDATE:  12-Jun-1931  GENDER:  female     ENDOSCOPIST:  Rachael Fee, MD     PROCEDURE DATE:  02/06/2010     PROCEDURE:  ERCP with stent placement           INDICATIONS:  recent painless jaundice, failed ERCP (Dr. Bluford Kaufmann),     eventual internal/external biliary drain placed by [facility] IR;     EUS 2 weeks ago showed diffusely abnormal head of pancreas, FNA     showed no dysplasia; +double duct sign           MEDICATIONS:  Fentanyl 50 mcg IV, Versed 6 mg IV, glucagon 0.5 mg     IV, kefzol (in IR prior to wire placement)     TOPICAL ANESTHETIC:  Cetacaine Spray           DESCRIPTION OF PROCEDURE:   After the risks benefits and     alternatives of the procedure were thoroughly explained, informed     consent was obtained.  The  endoscope was introduced through the     mouth and advanced to the second portion of the duodenum without     detailed examination of the UGI tract.  The internal external     biliary wire was found extending deeply into duodenum. This was     grabbed with snare and then pulled through the duodenoscope.  A 44     Autotome was then backloaded over the wire and introduced though     scope and advanced to the major papilla. There was evidence of     previous "needle knife" sphincterotomhy by Dr. Bluford Kaufmann.  The catheter     was advanced into the CBD and dye was injected. Cholangiogram     revealed dilated CBD and intrahepatic ducts without filling     defects (CBD dilated up to 11mm). There was a tight, distal CBD     stricture that appeared to be 3cm long. The stricture was brushed     for cytology and then a 7cm long, 10Fr plastic biliary stent  was     placed across the stricture in good position. The internal     external biliary wire was then removed from patient (externally).     The main pancreatic duct was never cannulated nor injected with     dye.           <<PROCEDUREIMAGES>>           IMPRESSION:  3cm long, tight distal CBD stricture; brushed for     cytology and then stented with 10Fr, 7cm long plastic biliary     stent using       an internal/external biliary wire placed earlier     today by IR.           RECOMMENDATIONS:     Await biliary brushing results.  ______________________________     Rachael Fee, MD           cc: Dr. Lutricia Feil, Dr. Marguerite Olea.           n.     eSIGNED:   Rachael Fee at 02/06/2010 12:09 PM           Charlyne Mom, [phone]  Note: An exclamation mark (!) indicates a result that was not dispersed into the flowsheet. Document Creation Date: 02/06/2010 12:10 PM _______________________________________________________________________  (1) Order result status: Final Collection or observation date-time: 02/06/2010 11:57 Requested date-time:  Receipt date-time:  Reported date-time:  Referring Physician:   Ordering Physician: Rob Bunting [id number]) Specimen Source:  Source: Launa Grill Order Number: [id number] Lab site:

## 2010-11-14 NOTE — Progress Notes (Signed)
Summary: Triage  Phone Note From Other Clinic   Caller: Bonita Quin @ [facility]  [phone] Call For: Dr. Christella Hartigan   Summary of Call: pt. called there office and told Dr. Christella Hartigan said it would be okay for them to do her Labs tomorrow....they need the orders for Lab work Initial call taken by: Karna Christmas,  July 25, 2010 11:13 AM  Follow-up for Phone Call        order will be faxed to [facility] for LFT's [phone] Follow-up by: Chales Abrahams CMA Duncan Dull),  July 25, 2010 11:21 AM     Appended Document: Triage recieved lfts dated 10/20: aphos 551. ast 148, alt 90.    please call her,  these are up from about a week prior but not by too much.  ask how she is feeling, any itching (pruritis) any changes in her meds?  needs repeat LFTs in 2 weeks.  Appended Document: Triage left message on machine to call back   Appended Document: Triage pt has no itching or complaints and feels much better,   labs in [facility]   Appended Document: Triage pt will have labs at [facility] NOT [facility]  Appended Document: Triage received faxed labs dated 11.9.11 alk phos 375, ast 66, alt 54  please call her, labs continuing to improve.  can space out her lfts to once a month now (results faxed here)  Appended Document: Orders Update pt aware [facility] notified   Clinical Lists Changes

## 2010-11-14 NOTE — Progress Notes (Signed)
Summary: EUS  Phone Note Outgoing Call   Call placed by: Chales Abrahams CMA Duncan Dull),  January 24, 2010 9:07 AM Summary of Call: called [facility] to have pt transferred to [facility] and the Nurse in charge Crista Elliot states the pt is going to be discharged today.  I am faxing the instructions to her and she will make the pt aware.  fax number [phone]  phone number [phone] Initial call taken by: Chales Abrahams CMA Duncan Dull),  January 24, 2010 9:10 AM  Follow-up for Phone Call        called to confirm Anitra did recieve the fax and pt is aware. Follow-up by: Chales Abrahams CMA Duncan Dull),  January 24, 2010 9:49 AM  New Problems: NONSPECIFIC ABN FINDING RAD & OTH EXAM GI TRACT (ICD-793.4)   New Problems: NONSPECIFIC ABN FINDING RAD & OTH EXAM GI TRACT (ICD-793.4)

## 2010-11-14 NOTE — Letter (Signed)
Summary: EGD Instructions  [facility]  [address] [place], [place] [address]   Phone: [phone]  Fax: [phone]       [name]    [birth year]    MRN: [id number]       Procedure Day /Date:03/30/10     Arrival Time: 1045     Procedure Time:1145     Location of Procedure:                     [facility] ( Outpatient Registration)    PREPARATION FOR ENDOSCOPY   On 03/30/10 THE DAY OF THE PROCEDURE:  1.   Nothing to eat or drink after midnight.                                                  MEDICATION INSTRUCTIONS  Unless otherwise instructed, you should take regular prescription medications with a small sip of water as early as possible the morning of your procedure.  Diabetic patients - see separate instructions.               OTHER INSTRUCTIONS  You will need a responsible adult at least 75 years of age to accompany you and drive you home.   This person must remain in the waiting room during your procedure.  Wear loose fitting clothing that is easily removed.  Leave jewelry and other valuables at home.  However, you may wish to bring a book to read or an iPod/MP3 player to listen to music as you wait for your procedure to start.  Remove all body piercing jewelry and leave at home.  Total time from sign-in until discharge is approximately 2-3 hours.  You should go home directly after your procedure and rest.  You can resume normal activities the day after your procedure.  The day of your procedure you should not:   Drive   Make legal decisions   Operate machinery   Drink alcohol   Return to work  You will receive specific instructions about eating, activities and medications before you leave.    The above instructions have been reviewed and explained to me by   Chales Abrahams CMA Duncan Dull)  March 29, 2010 11:59 AM     I fully understand and can verbalize these instructions over the phone Date 03/29/10

## 2010-11-15 ENCOUNTER — Other Ambulatory Visit: Payer: Self-pay | Admitting: Diagnostic Radiology

## 2010-11-15 ENCOUNTER — Encounter: Payer: Self-pay | Admitting: Oncology

## 2010-11-15 ENCOUNTER — Other Ambulatory Visit: Payer: Self-pay | Admitting: Oncology

## 2010-11-15 DIAGNOSIS — C259 Malignant neoplasm of pancreas, unspecified: Secondary | ICD-10-CM

## 2010-11-15 DIAGNOSIS — K769 Liver disease, unspecified: Secondary | ICD-10-CM

## 2010-11-16 NOTE — Letter (Signed)
Summary: [facility]  [facility]   Imported By: Lester [place]erton 09/27/2010 09:07:15  _____________________________________________________________________  External Attachment:    Type:   Image     Comment:   External Document

## 2010-11-21 ENCOUNTER — Ambulatory Visit (HOSPITAL_COMMUNITY)
Admission: RE | Admit: 2010-11-21 | Discharge: 2010-11-21 | Disposition: A | Discharge status: Home / Self Care | Payer: MEDICARE | Source: Ambulatory Visit | Attending: Oncology | Admitting: Oncology

## 2010-11-21 ENCOUNTER — Other Ambulatory Visit: Payer: Self-pay | Admitting: Oncology

## 2010-11-21 DIAGNOSIS — C787 Secondary malignant neoplasm of liver and intrahepatic bile duct: Secondary | ICD-10-CM | POA: Insufficient documentation

## 2010-11-21 DIAGNOSIS — I10 Essential (primary) hypertension: Secondary | ICD-10-CM | POA: Insufficient documentation

## 2010-11-21 DIAGNOSIS — C259 Malignant neoplasm of pancreas, unspecified: Secondary | ICD-10-CM | POA: Insufficient documentation

## 2010-11-21 DIAGNOSIS — E119 Type 2 diabetes mellitus without complications: Secondary | ICD-10-CM | POA: Insufficient documentation

## 2010-11-21 LAB — CBC
MCHC: 33.5 g/dL (ref 30.0–36.0)
Platelets: 149 10*3/uL — ABNORMAL LOW (ref 150–400)
RDW: 13.9 % (ref 11.5–15.5)
WBC: 4.2 10*3/uL (ref 4.0–10.5)

## 2010-11-21 LAB — COMPREHENSIVE METABOLIC PANEL
Albumin: 3.5 g/dL (ref 3.5–5.2)
Alkaline Phosphatase: 185 U/L — ABNORMAL HIGH (ref 39–117)
BUN: 7 mg/dL (ref 6–23)
Creatinine, Ser: 0.59 mg/dL (ref 0.4–1.2)
Glucose, Bld: 104 mg/dL — ABNORMAL HIGH (ref 70–99)
Potassium: 3.5 mEq/L (ref 3.5–5.1)
Total Bilirubin: 0.7 mg/dL (ref 0.3–1.2)
Total Protein: 7.1 g/dL (ref 6.0–8.3)

## 2010-11-21 LAB — PROTIME-INR
INR: 0.98 (ref 0.00–1.49)
Prothrombin Time: 13.2 seconds (ref 11.6–15.2)

## 2010-11-21 LAB — ABO/RH: ABO/RH(D): A POS

## 2010-11-24 ENCOUNTER — Ambulatory Visit (HOSPITAL_COMMUNITY)
Admission: RE | Admit: 2010-11-24 | Discharge: 2010-11-25 | Disposition: A | Discharge status: Home / Self Care | Payer: MEDICARE | Source: Ambulatory Visit | Attending: Diagnostic Radiology | Admitting: Diagnostic Radiology

## 2010-11-24 ENCOUNTER — Ambulatory Visit (HOSPITAL_COMMUNITY): Payer: MEDICARE

## 2010-11-24 DIAGNOSIS — Z01812 Encounter for preprocedural laboratory examination: Secondary | ICD-10-CM | POA: Insufficient documentation

## 2010-11-24 DIAGNOSIS — Z79899 Other long term (current) drug therapy: Secondary | ICD-10-CM | POA: Insufficient documentation

## 2010-11-24 DIAGNOSIS — I10 Essential (primary) hypertension: Secondary | ICD-10-CM | POA: Insufficient documentation

## 2010-11-24 DIAGNOSIS — C259 Malignant neoplasm of pancreas, unspecified: Secondary | ICD-10-CM | POA: Insufficient documentation

## 2010-11-24 DIAGNOSIS — C787 Secondary malignant neoplasm of liver and intrahepatic bile duct: Secondary | ICD-10-CM | POA: Insufficient documentation

## 2010-11-24 DIAGNOSIS — K769 Liver disease, unspecified: Secondary | ICD-10-CM

## 2010-11-24 DIAGNOSIS — J45909 Unspecified asthma, uncomplicated: Secondary | ICD-10-CM | POA: Insufficient documentation

## 2010-11-24 DIAGNOSIS — E119 Type 2 diabetes mellitus without complications: Secondary | ICD-10-CM | POA: Insufficient documentation

## 2010-11-24 LAB — TYPE AND SCREEN
ABO/RH(D): A POS
Antibody Screen: NEGATIVE

## 2010-11-24 LAB — GLUCOSE, CAPILLARY
Glucose-Capillary: 110 mg/dL — ABNORMAL HIGH (ref 70–99)
Glucose-Capillary: 187 mg/dL — ABNORMAL HIGH (ref 70–99)
Glucose-Capillary: 245 mg/dL — ABNORMAL HIGH (ref 70–99)

## 2010-11-24 MED ORDER — IOHEXOL 300 MG/ML  SOLN
75.0000 mL | Freq: Once | INTRAMUSCULAR | Status: AC | PRN
  Administered 2010-11-24: 75 mL via INTRAVENOUS

## 2010-11-25 ENCOUNTER — Other Ambulatory Visit (HOSPITAL_COMMUNITY): Payer: Self-pay

## 2010-11-25 LAB — CBC
MCH: 31.8 pg (ref 26.0–34.0)
MCHC: 33.1 g/dL (ref 30.0–36.0)
MCV: 96 fL (ref 78.0–100.0)
Platelets: 95 10*3/uL — ABNORMAL LOW (ref 150–400)
RBC: 3.27 MIL/uL — ABNORMAL LOW (ref 3.87–5.11)
RDW: 14 % (ref 11.5–15.5)

## 2010-11-25 LAB — GLUCOSE, CAPILLARY: Glucose-Capillary: 98 mg/dL (ref 70–99)

## 2010-11-25 LAB — COMPREHENSIVE METABOLIC PANEL
Alkaline Phosphatase: 185 U/L — ABNORMAL HIGH (ref 39–117)
BUN: 8 mg/dL (ref 6–23)
CO2: 29 mEq/L (ref 19–32)
Chloride: 109 mEq/L (ref 96–112)
GFR calc non Af Amer: >60 mL/min (ref >60)
Glucose, Bld: 149 mg/dL — ABNORMAL HIGH (ref 70–99)
Potassium: 3.9 mEq/L (ref 3.5–5.1)
Total Bilirubin: 0.8 mg/dL (ref 0.3–1.2)

## 2010-12-08 ENCOUNTER — Telehealth: Payer: Self-pay | Admitting: Gastroenterology

## 2010-12-12 NOTE — Progress Notes (Signed)
Summary: Triage--itching  Phone Note Call from Patient   Caller: Daughter Junious Dresser [phone] Call For: Dr. Christella Hartigan Reason for Call: Talk to Nurse Summary of Call: Asking to speak directly to Patty. She is itching bad and wants to know if that has something to do with her liver Initial call taken by: Karna Christmas,  December 08, 2010 8:10 AM  Follow-up for Phone Call        Pt has been taking Augmentin since the 16th of February and the itching started on the 20th.  She was put on Augmentin by Dr Fredia Sorrow (IR) for an infected site where she had a liver ablation. I advised her to call Dr Antonietta Jewel office since he out her on the Augmentin and one of the side effects is itching,  she will call back if they feel she needs to be seen here and I will forward  this message to Dr Christella Hartigan for review.  Follow-up by: Chales Abrahams CMA Duncan Dull),  December 08, 2010 9:37 AM  Additional Follow-up for Phone Call Additional follow up Details #1::        i agree Additional Follow-up by: Rachael Fee MD,  December 08, 2010 10:58 AM

## 2010-12-13 ENCOUNTER — Encounter (HOSPITAL_BASED_OUTPATIENT_CLINIC_OR_DEPARTMENT_OTHER): Payer: MEDICARE | Admitting: Oncology

## 2010-12-13 ENCOUNTER — Encounter: Payer: Self-pay | Admitting: Gastroenterology

## 2010-12-13 ENCOUNTER — Other Ambulatory Visit: Payer: Self-pay | Admitting: Oncology

## 2010-12-13 DIAGNOSIS — E119 Type 2 diabetes mellitus without complications: Secondary | ICD-10-CM

## 2010-12-13 DIAGNOSIS — C250 Malignant neoplasm of head of pancreas: Secondary | ICD-10-CM

## 2010-12-13 DIAGNOSIS — D649 Anemia, unspecified: Secondary | ICD-10-CM

## 2010-12-13 DIAGNOSIS — R7989 Other specified abnormal findings of blood chemistry: Secondary | ICD-10-CM

## 2010-12-13 LAB — CBC WITH DIFFERENTIAL/PLATELET
Basophils Absolute: 0 10*3/uL (ref 0.0–0.1)
Eosinophils Absolute: 0 10*3/uL (ref 0.0–0.5)
HCT: 34.9 % (ref 34.8–46.6)
HGB: 11.7 g/dL (ref 11.6–15.9)
MCH: 31.8 pg (ref 25.1–34.0)
MCV: 94.8 fL (ref 79.5–101.0)
MONO%: 5.4 % (ref 0.0–14.0)
NEUT#: 7.8 10*3/uL — ABNORMAL HIGH (ref 1.5–6.5)
NEUT%: 89.3 % — ABNORMAL HIGH (ref 38.4–76.8)
RDW: 13.5 % (ref 11.2–14.5)

## 2010-12-13 LAB — COMPREHENSIVE METABOLIC PANEL
Albumin: 2.9 g/dL — ABNORMAL LOW (ref 3.5–5.2)
Alkaline Phosphatase: 258 U/L — ABNORMAL HIGH (ref 39–117)
BUN: 9 mg/dL (ref 6–23)
Calcium: 8.6 mg/dL (ref 8.4–10.5)
Chloride: 97 mEq/L (ref 96–112)
Creatinine, Ser: 0.82 mg/dL (ref 0.40–1.20)
Glucose, Bld: 203 mg/dL — ABNORMAL HIGH (ref 70–99)
Potassium: 3.2 mEq/L — ABNORMAL LOW (ref 3.5–5.3)

## 2010-12-14 ENCOUNTER — Inpatient Hospital Stay: Payer: Self-pay | Admitting: Internal Medicine

## 2010-12-18 ENCOUNTER — Inpatient Hospital Stay (HOSPITAL_COMMUNITY): Payer: MEDICARE

## 2010-12-18 ENCOUNTER — Inpatient Hospital Stay (HOSPITAL_COMMUNITY)
Admission: AD | Admit: 2010-12-18 | Discharge: 2010-12-23 | DRG: 442 | Disposition: A | Discharge status: Home / Self Care | Payer: MEDICARE | Source: Other Acute Inpatient Hospital | Attending: Internal Medicine | Admitting: Internal Medicine

## 2010-12-18 DIAGNOSIS — N390 Urinary tract infection, site not specified: Secondary | ICD-10-CM | POA: Diagnosis present

## 2010-12-18 DIAGNOSIS — E785 Hyperlipidemia, unspecified: Secondary | ICD-10-CM | POA: Diagnosis present

## 2010-12-18 DIAGNOSIS — D696 Thrombocytopenia, unspecified: Secondary | ICD-10-CM | POA: Diagnosis present

## 2010-12-18 DIAGNOSIS — C787 Secondary malignant neoplasm of liver and intrahepatic bile duct: Secondary | ICD-10-CM | POA: Diagnosis present

## 2010-12-18 DIAGNOSIS — B961 Klebsiella pneumoniae [K. pneumoniae] as the cause of diseases classified elsewhere: Secondary | ICD-10-CM | POA: Diagnosis present

## 2010-12-18 DIAGNOSIS — C259 Malignant neoplasm of pancreas, unspecified: Secondary | ICD-10-CM | POA: Diagnosis present

## 2010-12-18 DIAGNOSIS — Z66 Do not resuscitate: Secondary | ICD-10-CM | POA: Diagnosis present

## 2010-12-18 DIAGNOSIS — A498 Other bacterial infections of unspecified site: Secondary | ICD-10-CM | POA: Diagnosis present

## 2010-12-18 DIAGNOSIS — E119 Type 2 diabetes mellitus without complications: Secondary | ICD-10-CM | POA: Diagnosis present

## 2010-12-18 DIAGNOSIS — Z9221 Personal history of antineoplastic chemotherapy: Secondary | ICD-10-CM

## 2010-12-18 DIAGNOSIS — R443 Hallucinations, unspecified: Secondary | ICD-10-CM | POA: Diagnosis present

## 2010-12-18 DIAGNOSIS — E876 Hypokalemia: Secondary | ICD-10-CM | POA: Diagnosis present

## 2010-12-18 DIAGNOSIS — K750 Abscess of liver: Principal | ICD-10-CM | POA: Diagnosis present

## 2010-12-18 DIAGNOSIS — I10 Essential (primary) hypertension: Secondary | ICD-10-CM | POA: Diagnosis present

## 2010-12-18 DIAGNOSIS — D649 Anemia, unspecified: Secondary | ICD-10-CM | POA: Diagnosis present

## 2010-12-18 HISTORY — DX: Malignant neoplasm of pancreas, unspecified: C25.9

## 2010-12-18 LAB — CBC
Hemoglobin: 11 g/dL — ABNORMAL LOW (ref 12.0–15.0)
MCH: 30.8 pg (ref 26.0–34.0)
Platelets: 78 10*3/uL — ABNORMAL LOW (ref 150–400)
RBC: 3.57 MIL/uL — ABNORMAL LOW (ref 3.87–5.11)
WBC: 15.5 10*3/uL — ABNORMAL HIGH (ref 4.0–10.5)

## 2010-12-18 LAB — COMPREHENSIVE METABOLIC PANEL
ALT: 37 U/L — ABNORMAL HIGH (ref 0–35)
AST: 67 U/L — ABNORMAL HIGH (ref 0–37)
Albumin: 1.9 g/dL — ABNORMAL LOW (ref 3.5–5.2)
Calcium: 7.9 mg/dL — ABNORMAL LOW (ref 8.4–10.5)
Creatinine, Ser: 0.67 mg/dL (ref 0.4–1.2)
GFR calc Af Amer: >60 mL/min (ref >60)
GFR calc non Af Amer: >60 mL/min (ref >60)
Sodium: 141 mEq/L (ref 135–145)
Total Protein: 4.8 g/dL — ABNORMAL LOW (ref 6.0–8.3)

## 2010-12-18 LAB — PROTIME-INR
INR: 1.11 (ref 0.00–1.49)
Prothrombin Time: 14.5 seconds (ref 11.6–15.2)

## 2010-12-18 LAB — APTT: aPTT: 28 seconds (ref 24–37)

## 2010-12-18 LAB — GLUCOSE, CAPILLARY: Glucose-Capillary: 76 mg/dL (ref 70–99)

## 2010-12-19 ENCOUNTER — Inpatient Hospital Stay (HOSPITAL_COMMUNITY): Payer: MEDICARE

## 2010-12-19 DIAGNOSIS — K750 Abscess of liver: Secondary | ICD-10-CM

## 2010-12-19 LAB — GLUCOSE, CAPILLARY
Glucose-Capillary: 114 mg/dL — ABNORMAL HIGH (ref 70–99)
Glucose-Capillary: 115 mg/dL — ABNORMAL HIGH (ref 70–99)
Glucose-Capillary: 128 mg/dL — ABNORMAL HIGH (ref 70–99)
Glucose-Capillary: 166 mg/dL — ABNORMAL HIGH (ref 70–99)

## 2010-12-19 LAB — CBC
HCT: 32.2 % — ABNORMAL LOW (ref 36.0–46.0)
MCHC: 33.9 g/dL (ref 30.0–36.0)
Platelets: 82 10*3/uL — ABNORMAL LOW (ref 150–400)
RDW: 14.2 % (ref 11.5–15.5)
WBC: 11.6 10*3/uL — ABNORMAL HIGH (ref 4.0–10.5)

## 2010-12-19 LAB — COMPREHENSIVE METABOLIC PANEL
ALT: 33 U/L (ref 0–35)
Albumin: 1.6 g/dL — ABNORMAL LOW (ref 3.5–5.2)
Calcium: 7.4 mg/dL — ABNORMAL LOW (ref 8.4–10.5)
Glucose, Bld: 156 mg/dL — ABNORMAL HIGH (ref 70–99)
Sodium: 139 mEq/L (ref 135–145)
Total Protein: 4.3 g/dL — ABNORMAL LOW (ref 6.0–8.3)

## 2010-12-20 LAB — IRON AND TIBC: Saturation Ratios: 38 % (ref 20–55)

## 2010-12-20 LAB — CBC
HCT: 36.1 % (ref 36.0–46.0)
MCH: 30.5 pg (ref 26.0–34.0)
MCHC: 32.4 g/dL (ref 30.0–36.0)
MCV: 94.3 fL (ref 78.0–100.0)
RDW: 14.3 % (ref 11.5–15.5)

## 2010-12-20 LAB — COMPREHENSIVE METABOLIC PANEL
ALT: 28 U/L (ref 0–35)
AST: 28 U/L (ref 0–37)
Albumin: 1.7 g/dL — ABNORMAL LOW (ref 3.5–5.2)
Alkaline Phosphatase: 278 U/L — ABNORMAL HIGH (ref 39–117)
Potassium: 4.1 mEq/L (ref 3.5–5.1)
Sodium: 140 mEq/L (ref 135–145)
Total Protein: 4.5 g/dL — ABNORMAL LOW (ref 6.0–8.3)

## 2010-12-20 LAB — VITAMIN B12: Vitamin B-12: 1115 pg/mL — ABNORMAL HIGH (ref 211–911)

## 2010-12-20 LAB — GLUCOSE, CAPILLARY
Glucose-Capillary: 119 mg/dL — ABNORMAL HIGH (ref 70–99)
Glucose-Capillary: 162 mg/dL — ABNORMAL HIGH (ref 70–99)

## 2010-12-20 LAB — FOLATE: Folate: 13.3 ng/mL

## 2010-12-21 LAB — COMPREHENSIVE METABOLIC PANEL
AST: 26 U/L (ref 0–37)
Albumin: 2 g/dL — ABNORMAL LOW (ref 3.5–5.2)
Chloride: 108 mEq/L (ref 96–112)
Creatinine, Ser: 0.58 mg/dL (ref 0.4–1.2)
GFR calc Af Amer: >60 mL/min (ref >60)
Potassium: 3.6 mEq/L (ref 3.5–5.1)
Total Bilirubin: 1.1 mg/dL (ref 0.3–1.2)
Total Protein: 5.1 g/dL — ABNORMAL LOW (ref 6.0–8.3)

## 2010-12-21 LAB — GLUCOSE, CAPILLARY
Glucose-Capillary: 167 mg/dL — ABNORMAL HIGH (ref 70–99)
Glucose-Capillary: 178 mg/dL — ABNORMAL HIGH (ref 70–99)
Glucose-Capillary: 189 mg/dL — ABNORMAL HIGH (ref 70–99)

## 2010-12-21 LAB — MRSA CULTURE

## 2010-12-21 LAB — CBC
MCH: 30.9 pg (ref 26.0–34.0)
Platelets: 140 10*3/uL — ABNORMAL LOW (ref 150–400)
RBC: 4.04 MIL/uL (ref 3.87–5.11)
RDW: 14.2 % (ref 11.5–15.5)
WBC: 7.1 10*3/uL (ref 4.0–10.5)

## 2010-12-22 ENCOUNTER — Inpatient Hospital Stay (HOSPITAL_COMMUNITY): Payer: MEDICARE

## 2010-12-22 ENCOUNTER — Encounter (HOSPITAL_COMMUNITY): Payer: Self-pay | Admitting: Radiology

## 2010-12-22 MED ORDER — IOHEXOL 300 MG/ML  SOLN
100.0000 mL | Freq: Once | INTRAMUSCULAR | Status: AC | PRN
  Administered 2010-12-22: 100 mL via INTRAVENOUS

## 2010-12-23 LAB — GLUCOSE, CAPILLARY
Glucose-Capillary: 161 mg/dL — ABNORMAL HIGH (ref 70–99)
Glucose-Capillary: 166 mg/dL — ABNORMAL HIGH (ref 70–99)
Glucose-Capillary: 132 mg/dL — ABNORMAL HIGH (ref 70–99)

## 2010-12-23 LAB — BODY FLUID CULTURE

## 2010-12-25 LAB — GLUCOSE, CAPILLARY: Glucose-Capillary: 252 mg/dL — ABNORMAL HIGH (ref 70–99)

## 2010-12-27 NOTE — H&P (Signed)
NAME:  Rose, Small NO.:  [id number]  MEDICAL RECORD NO.:  [id number]           PATIENT TYPE:  I  LOCATION:  1239                         FACILITY:  [facility]  PHYSICIAN:  Osvaldo Shipper, MD     DATE OF BIRTH:  May 03, 1931  DATE OF ADMISSION:  12/18/2010 DATE OF DISCHARGE:                             HISTORY & PHYSICAL   PRIMARY CARE PHYSICIAN:  Dr. Wonda Cheng in [place], [place].  ONCOLOGIST:  Drue Second, MD, here in [facility].  Please note, the patient was transferred from [facility] to [facility] for higher level of care.  ADMISSION DIAGNOSES: 1. Liver abscess. 2. Klebsiella in the blood. 3. Escherichia coli in the urine. 4. History of pancreatic cancer, status post Whipple procedure at     [facility]. 5. History of diabetes type 2. 6. History of hypertension.  CHIEF COMPLAINT:  Liver abscess.  HISTORY OF PRESENT ILLNESS:  The patient is a 77 year old Caucasian female who has a history of pancreatic duct carcinoma, status post Whipple procedure, chemoradiation.  The Whipple procedure was done in [facility] sometime in July.  The patient presented to the [facility] on March 1st with complaints of fever, shaking chills, and diarrhea.  The patient was actually seen here at [facility] on February 10th and had ablation of her right liver metastatic process.  She was apparently found to have some kind of infection in the flank and was started on Augmentin.  The patient started having itching in her arms, in her feet, and started having diarrhea at the same time and so she stopped the antibiotic and went to see her PMD where she was found to have a low potassium. This was replaced orally and then she was sent home and then she developed fever and then came to hospital at [facility] and was admitted over there.  Over the last 5 days at [facility], the patient underwent workup which  included blood cultures, urine cultures, imaging studies. Unfortunately, the hospital has not sent me the entire chart.  I am missing culture reports and CT reports.  However, the attending physician who transferred the patient here, Dr. Fonnie Birkenhead, he told me that the CT did show air-fluid levels and liver abscess.  So the patient requested transfer to [facility] for further management.  The patient currently is feeling well, denies any pain.  The daughter told me that since yesterday the patient has been having visual hallucinations, has been seeing some bugs, has been seeing individuals in the room which is quite unusual for her.  She does not have any other focal neurological deficits, any weakness, or anything of that sort.  She has been having chills as well on a daily basis, last episode was yesterday.  She has had 3 soft, loose stools today as well.  MEDICATIONS AT HOME:  Include the following, 1. Ascriptin enteric 81 mg daily. 2. Calcarb with vitamin D 2 orally once daily. 3. Flonase 2 sprays nasally once a day. 4. Ibuprofen as needed. 5. Prinzide 20/12.5 once daily. 6. Vitamin D3 once per day.  7. Potassium chloride.  It is unclear if she is on Ambien, Claritin, fish oil, glipizide, and pravastatin, it is unclear at this time.  ALLERGIES: 1. HYDROCODONE. 2. HEPARIN. 3. CONTRAST MEDIA. 4. Apparently AUGMENTIN because of the itching sensation.  PAST MEDICAL HISTORY:  Positive for pancreatic duct cancer, status post Whipple procedure done in [facility] on April 18, 2010.  She has had a tonsillectomy, varicose vein stripping, appendectomy, bilateral tubal ligation, cholecystectomy with the Whipple procedure and bilateral cataract surgery.  Past medical history also includes type 2 diabetes, hyperlipidemia, hypertension, and history of perihepatic abscess in May 2011 which was aspirated at that time here in our system.  MEDICATIONS:  Apparently also include Levemir, she  takes 10 units in the morning.  SOCIAL HISTORY:  She is married, has 3 children, lives in [place], denies any alcohol or tobacco abuse.  She drinks 9 cups of coffee per week. Modified diabetic diet.  She, otherwise, fairly independent, walks at least 2 miles every day.  She used to be a Theatre manager.  FAMILY HISTORY:  Positive for Alzheimer's and asthma.  REVIEW OF SYSTEMS:  Has been reviewed elsewhere.  PHYSICAL EXAMINATION:  VITAL SIGNS:  She is afebrile at this time. Blood pressure is 117/70s, heart rate in the 60s to 70s regular, respiratory rate 14, saturation 95% on room air. GENERAL:  An elderly thin white female, in no distress. HEENT:  Head is normocephalic, atraumatic.  Pupils are equal reacting. No pallor.  No icterus.  Oral, mucous membranes moist.  No oral lesions are noted. NECK:  Soft and supple.  No thyromegaly is appreciated. LUNGS:  Clear to auscultation anteriorly bilaterally.  No wheezing, rales, or rhonchi. CARDIOVASCULAR:  S1 and S2 is normal, regular.  No S3, S4, rubs, murmurs, or bruits. ABDOMEN:  Soft, nontender, nondistended.  Bowel sounds are present.  No masses or organomegaly is appreciated. GU:  Deferred. MUSCULOSKELETAL:  Normal muscle mass and tone. NEUROLOGIC:  She is alert, oriented.  No focal neurological deficits are present.  She follows commands quite well.  No labs available as of today.  We do have certain labs from [place] and these were apparently drawn this morning, glucose was 98, BUN 11, creatinine 0.78, sodium 141, potassium 4.0, chloride 109, bicarbonate is 24, calcium 7.5.  WBC was 13.3, hemoglobin 10.2, platelet count 65. Review of previous labs show that platelet count was 84 on the 4th.  She has had negative C difficile over at [facility] that she grew gram- negative rods at [facility] and does grew out to be Klebsiella oxytoca. She had urine cultures that grew E coli.  IMAGING STUDIES:  She had a CT apparently  which showed liver abscess, unfortunately I do not have the report.  ASSESSMENT:  This is a 77 year old Caucasian female who presented to  5 days ago with fever, chills, diarrhea.  Initially, there was a thought that she may have Clostridium difficile, however, later evaluation revealed liver abscess and a possible UTI.  I was told by the physician's friends who transferred the patient here that both the bugs were sensitive to Levaquin and so she was started on Levaquin.  PLAN: 1. Liver abscess.  We did get the CD with the imaging studies and I     have given that CD to Dr. Fredia Sorrow who briefly looked at it and did     see a liver abscess and he will plan to do drainage tomorrow, most     probably be done  by one of our colleagues  We will keep the patient     n.p.o. past midnight. 2. Hallucinations.  Etiology for this is not entirely clear.  She does     not have any focal neurological deficits.  She, however was,     started on Levaquin recently and that sometimes can cause some     psychiatric symptoms.  It is unclear if it can cause     hallucinations.  Since she does have a history of cancer I am going     to ago and proceed with CT abdomen to make sure there is no     intracranial finding at this time.  Even though she does not have     any focal neurological deficits. 3. ID issues.  She apparently has positive blood cultures for     klebsiella and urine culture for E coli, both sensitive to     Levaquin.  Will continue IV Levaquin for now. 4. Diabetes.  She will be on sliding scale and CBGs.  Once we get the     appropriate dose of her medications, we will restart them here. 5. History of pancreatic cancer, status post Whipple with recent     ablation of her liver metastatic process.  This we will inform Dr.     Welton Flakes of this patient's admission in the morning. 6. The patient is a DNR. 7. We will request CT reports, culture reports, inpatient medication     list from  [facility]. 8. Loose stool C difficile was negative.  We will give her Imodium as     needed. 9. Thrombocytopenia.  I am trying to see if we have any labs in our     system on this individual and she did have earlier in February some     labs and her platelet count was 95621, so thrombocytopenia seems to     at least not new.  Further management decisions will depend on results of further testing and patient's response to treatment.  Osvaldo Shipper, MD     GK/MEDQ  D:  12/18/2010  T:  12/18/2010  Job:  [id number]  cc:   Drue Second, MD Fax: [phone]  Wonda Cheng Fax: [phone]  Electronically Signed by Osvaldo Shipper MD on 12/27/2010 07:33:36 PM

## 2010-12-27 NOTE — Discharge Summary (Signed)
[name], [name] NO.:  [id number]  MEDICAL RECORD NO.:  [id number]           PATIENT TYPE:  I  LOCATION:  1509                         FACILITY:  [facility]  PHYSICIAN:  Kathlen Mody, MD       DATE OF BIRTH:  [birth year]  DATE OF ADMISSION:  12/18/2010 DATE OF DISCHARGE:  12/23/2010                              DISCHARGE SUMMARY   PRIMARY CARE PHYSICIAN:  Wonda Cheng, M.D. in [place], [place].  ONCOLOGIST:  Drue Second, M.D.  DISCHARGE DIAGNOSES: 1. Liver abscess. 2. Pancreatic cancer, status post Whipple procedure. 3. Type 2 diabetes, insulin dependent diabetes. 4. Hypertension. 5. Hyperlipidemia. 6. Anemia.  DISCHARGE MEDICATIONS: 1. Avelox 400 mg p.o. daily for 22 days to complete the course of     antibiotics for 4 weeks. 2. Calcium citrate/vitamin D 1 tablet daily. 3. Levemir 10 units subcutaneous daily. 4. Potassium chloride 20 mEq 1 tablet p.o. 3 times a day. 5. Aspirin 81 mg daily. 6. Flonase 1 spray daily as needed. 7. NovoLog sliding scale subcutaneous q.p.m. p.r.n. if CBG is greater     than 150. 8. Benadryl 25 mg 1 tablet at bedtime as needed.  CONSULTATIONS CALLED: 1. Infectious Disease consult from Fransisco Hertz, M.D. 2. IR consult from Baxter T. Fredia Sorrow, M.D. 3. Oncology consult from Dr. Welton Flakes.  PROCEDURES DONE:  The patient had had IR drain from the liver abscess.  PERTINENT LABS:  The patient had CBC which was significant for platelets of 149,000.  Comprehensive metabolic panel which was significant for a glucose of 104 and alkaline phosphatase of 185.  On the day of discharge, the cultures from the liver abscess came back negative, no organisms seen, no wbc's seen.  RADIOLOGY:  The patient had a CT head without contrast, shows no acute abnormalities.  A CT guided abscess drainage was done on December 19, 2010, with a pigtail catheter drain placed in.  A CT abdomen with contrast done December 22, 2010, shows hypodensity  around pigtail catheter in the right hepatic lobe only with a small amount of residual intrahepatic fluid, small to moderate bilateral pleural effusions with passive atelectasis and mesenteric edema.  Postoperative findings related to prior partial pancreatectomy and partial gastrectomy.  BRIEF HOSPITAL COURSE: 1. This is a 77 year old lady with history of metastatic pancreatic     cancer status post Whipple procedure transferred from [facility] complaining of liver abscess symptoms, fevers,     chills, and diarrhea.  On arrival to the ER, she was started on IV     antibiotics, initially with IV Levaquin but she had hallucination     reaction to the IV Levaquin for which it was stopped and was     changed to IV Avelox.  Interventional Radiology consult was called,     and she underwent CT-guided liver abscess pigtail catheter drainage     and placement of a catheter.  Cultures were sent which came back     negative so far.  ID consult was called, recommended 4 weeks of     antibiotics, Avelox.  Since  the cultures came back negative so far,     she can continue with p.o. Avelox for 4 weeks and recommended to     follow with [facility] in about 3-4 weeks and also to     follow with IR in about of 4 weeks.  She was given education about     the outpatient care of the site with the family and with the     patient.  On admission, the patient had records which showed     positive blood cultures to Klebsiella from [facility]     and urine culture positive for E-coli, both were sensitive to     quinolones, so she would continue with Avelox to complete the     course of 4 weeks of antibiotics. 2. Insulin-dependent diabetes.  Continue with Levemir and NovoLog as     per the patient's home dose. 3. Pancreatic cancer, status post Whipple procedure.  Dr. Welton Flakes was     informed of the patient's admission.  Dr. Welton Flakes came in to see her,     recommended  outpatient followup at this time. 4. Loose bowel movements.  The patient takes Imodium as needed at     home, will continue the same. 5. Thrombocytopenia.  The patient has it chronic, stable.  No bleeding     seen.  PHYSICAL EXAMINATION:  VITAL SIGNS:  On the day of discharge, the patient's vitals include temperature of 98.1, pulse of 82, blood pressure of 113/71, respirations 20, and saturating 98% on room air. GENERAL:  She is alert, afebrile, and comfortable sitting in chair. CARDIOVASCULAR:  S1-S2 heard.  No rubs, murmurs, or gallops. RESPIRATORY:  Good air entry bilateral. ABDOMEN:  Soft, nondistended, slightly sore at the site of the catheter placement but good bowel sounds. EXTREMITIES:  No pedal edema.  The patient at this time is hemodynamically stable for discharge.  It was recommended to follow with her PCP in about 1-2 weeks and to follow with Interventional Radiology in about 4 weeks and to follow with Infectious Disease in about 3-4 weeks at the [facility] and with Dr. Welton Flakes as per Oncology recommendations.          ______________________________ Kathlen Mody, MD     VA/MEDQ  D:  12/23/2010  T:  12/23/2010  Job:  [id number]  Electronically Signed by Kathlen Mody MD on 12/27/2010 04:54:09 PM

## 2010-12-31 LAB — GLUCOSE, CAPILLARY: Glucose-Capillary: 144 mg/dL — ABNORMAL HIGH (ref 70–99)

## 2011-01-01 LAB — DIFFERENTIAL
Basophils Relative: 1 % (ref 0–1)
Eosinophils Absolute: 0.2 10*3/uL (ref 0.0–0.7)
Eosinophils Relative: 2 % (ref 0–5)
Lymphs Abs: 2.8 10*3/uL (ref 0.7–4.0)
Neutrophils Relative %: 52 % (ref 43–77)

## 2011-01-01 LAB — CBC
HCT: 35.6 % — ABNORMAL LOW (ref 36.0–46.0)
MCHC: 35.1 g/dL (ref 30.0–36.0)
MCV: 97.6 fL (ref 78.0–100.0)
Platelets: 187 10*3/uL (ref 150–400)
RDW: 14.2 % (ref 11.5–15.5)
WBC: 7.2 10*3/uL (ref 4.0–10.5)

## 2011-01-01 LAB — BASIC METABOLIC PANEL
BUN: 10 mg/dL (ref 6–23)
CO2: 30 mEq/L (ref 19–32)
Chloride: 105 mEq/L (ref 96–112)
Creatinine, Ser: 0.78 mg/dL (ref 0.4–1.2)
Glucose, Bld: 107 mg/dL — ABNORMAL HIGH (ref 70–99)
Potassium: 3.3 mEq/L — ABNORMAL LOW (ref 3.5–5.1)

## 2011-01-01 LAB — GLUCOSE, CAPILLARY: Glucose-Capillary: 113 mg/dL — ABNORMAL HIGH (ref 70–99)

## 2011-01-02 LAB — GLUCOSE, CAPILLARY
Glucose-Capillary: 115 mg/dL — ABNORMAL HIGH (ref 70–99)
Glucose-Capillary: 119 mg/dL — ABNORMAL HIGH (ref 70–99)
Glucose-Capillary: 139 mg/dL — ABNORMAL HIGH (ref 70–99)
Glucose-Capillary: 150 mg/dL — ABNORMAL HIGH (ref 70–99)
Glucose-Capillary: 154 mg/dL — ABNORMAL HIGH (ref 70–99)
Glucose-Capillary: 162 mg/dL — ABNORMAL HIGH (ref 70–99)
Glucose-Capillary: 78 mg/dL (ref 70–99)
Glucose-Capillary: 88 mg/dL (ref 70–99)
Glucose-Capillary: 98 mg/dL (ref 70–99)

## 2011-01-02 LAB — CBC
HCT: 29 % — ABNORMAL LOW (ref 36.0–46.0)
Hemoglobin: 9.8 g/dL — ABNORMAL LOW (ref 12.0–15.0)
MCHC: 33.6 g/dL (ref 30.0–36.0)
MCHC: 34.2 g/dL (ref 30.0–36.0)
MCV: 97.5 fL (ref 78.0–100.0)
Platelets: 252 10*3/uL (ref 150–400)
RBC: 3.46 MIL/uL — ABNORMAL LOW (ref 3.87–5.11)
RDW: 13.7 % (ref 11.5–15.5)
RDW: 13.9 % (ref 11.5–15.5)
WBC: 5.5 10*3/uL (ref 4.0–10.5)

## 2011-01-02 LAB — BASIC METABOLIC PANEL
CO2: 30 mEq/L (ref 19–32)
Calcium: 8 mg/dL — ABNORMAL LOW (ref 8.4–10.5)
Calcium: 8.4 mg/dL (ref 8.4–10.5)
Creatinine, Ser: 0.63 mg/dL (ref 0.4–1.2)
GFR calc Af Amer: >60 mL/min (ref >60)
GFR calc Af Amer: >60 mL/min (ref >60)
GFR calc non Af Amer: >60 mL/min (ref >60)
GFR calc non Af Amer: >60 mL/min (ref >60)
Glucose, Bld: 156 mg/dL — ABNORMAL HIGH (ref 70–99)
Potassium: 3.5 mEq/L (ref 3.5–5.1)
Sodium: 138 mEq/L (ref 135–145)
Sodium: 142 mEq/L (ref 135–145)

## 2011-01-02 LAB — DIFFERENTIAL
Basophils Absolute: 0.2 10*3/uL — ABNORMAL HIGH (ref 0.0–0.1)
Lymphocytes Relative: 9 % — ABNORMAL LOW (ref 12–46)
Lymphs Abs: 1.5 10*3/uL (ref 0.7–4.0)
Monocytes Absolute: 1.4 10*3/uL — ABNORMAL HIGH (ref 0.1–1.0)
Neutro Abs: 13.4 10*3/uL — ABNORMAL HIGH (ref 1.7–7.7)

## 2011-01-02 LAB — URINALYSIS, ROUTINE W REFLEX MICROSCOPIC
Bilirubin Urine: NEGATIVE
Ketones, ur: NEGATIVE mg/dL
Nitrite: NEGATIVE
Protein, ur: NEGATIVE mg/dL
Specific Gravity, Urine: 1.02 (ref 1.005–1.030)
Urobilinogen, UA: 2 mg/dL — ABNORMAL HIGH (ref 0.0–1.0)

## 2011-01-02 LAB — HEPATIC FUNCTION PANEL
ALT: 53 U/L — ABNORMAL HIGH (ref 0–35)
AST: 44 U/L — ABNORMAL HIGH (ref 0–37)
Total Protein: 6.3 g/dL (ref 6.0–8.3)

## 2011-01-02 LAB — CULTURE, ROUTINE-ABSCESS

## 2011-01-02 LAB — COMPREHENSIVE METABOLIC PANEL
AST: 26 U/L (ref 0–37)
Albumin: 1.9 g/dL — ABNORMAL LOW (ref 3.5–5.2)
Calcium: 7.8 mg/dL — ABNORMAL LOW (ref 8.4–10.5)
Creatinine, Ser: 0.63 mg/dL (ref 0.4–1.2)
GFR calc Af Amer: >60 mL/min (ref >60)
GFR calc non Af Amer: >60 mL/min (ref >60)

## 2011-01-02 LAB — URINE CULTURE: Special Requests: NEGATIVE

## 2011-01-02 LAB — CANCER ANTIGEN 19-9: CA 19-9: 153.1 U/mL — ABNORMAL HIGH (ref <35.0)

## 2011-01-02 LAB — CEA: CEA: 0.7 ng/mL (ref 0.0–5.0)

## 2011-01-02 LAB — LACTATE DEHYDROGENASE: LDH: 159 U/L (ref 94–250)

## 2011-01-03 LAB — GLUCOSE, CAPILLARY

## 2011-01-04 ENCOUNTER — Other Ambulatory Visit: Payer: Self-pay | Admitting: Interventional Radiology

## 2011-01-04 ENCOUNTER — Other Ambulatory Visit: Payer: Self-pay | Admitting: Oncology

## 2011-01-04 DIAGNOSIS — C787 Secondary malignant neoplasm of liver and intrahepatic bile duct: Secondary | ICD-10-CM

## 2011-01-04 DIAGNOSIS — C259 Malignant neoplasm of pancreas, unspecified: Secondary | ICD-10-CM

## 2011-01-17 ENCOUNTER — Ambulatory Visit
Admission: RE | Admit: 2011-01-17 | Discharge: 2011-01-17 | Disposition: A | Discharge status: Home / Self Care | Payer: MEDICARE | Source: Ambulatory Visit | Attending: Oncology | Admitting: Oncology

## 2011-01-17 ENCOUNTER — Ambulatory Visit
Admission: RE | Admit: 2011-01-17 | Discharge: 2011-01-17 | Disposition: A | Discharge status: Home / Self Care | Payer: MEDICARE | Source: Ambulatory Visit | Attending: Interventional Radiology | Admitting: Interventional Radiology

## 2011-01-17 DIAGNOSIS — C787 Secondary malignant neoplasm of liver and intrahepatic bile duct: Secondary | ICD-10-CM

## 2011-01-17 DIAGNOSIS — C259 Malignant neoplasm of pancreas, unspecified: Secondary | ICD-10-CM

## 2011-01-17 MED ORDER — IOHEXOL 350 MG/ML SOLN
100.0000 mL | Freq: Once | INTRAVENOUS | Status: AC | PRN
  Administered 2011-01-17: 100 mL via INTRAVENOUS

## 2011-01-18 NOTE — Progress Notes (Signed)
Appetite good Weight stable 103 lbs.  Denies abdominal pain. Occasional diarrhea. Occasional n & v.  Afebrile. Completed Rx antibiotic 01-14-2011.  Sleep: improving. Endurance is slowly improving.  Able to do some light housework.  Wal[place] about 1.5[place]2 [place].

## 2011-02-05 ENCOUNTER — Encounter (HOSPITAL_BASED_OUTPATIENT_CLINIC_OR_DEPARTMENT_OTHER): Payer: MEDICARE | Admitting: Oncology

## 2011-02-05 ENCOUNTER — Other Ambulatory Visit: Payer: Self-pay | Admitting: Oncology

## 2011-02-05 DIAGNOSIS — R7989 Other specified abnormal findings of blood chemistry: Secondary | ICD-10-CM

## 2011-02-05 DIAGNOSIS — D649 Anemia, unspecified: Secondary | ICD-10-CM

## 2011-02-05 DIAGNOSIS — C250 Malignant neoplasm of head of pancreas: Secondary | ICD-10-CM

## 2011-02-05 DIAGNOSIS — E119 Type 2 diabetes mellitus without complications: Secondary | ICD-10-CM

## 2011-02-05 LAB — CBC WITH DIFFERENTIAL/PLATELET
Basophils Absolute: 0 10*3/uL (ref 0.0–0.1)
HCT: 34.1 % — ABNORMAL LOW (ref 34.8–46.6)
HGB: 11.5 g/dL — ABNORMAL LOW (ref 11.6–15.9)
MONO#: 0.3 10*3/uL (ref 0.1–0.9)
NEUT%: 69.5 % (ref 38.4–76.8)
WBC: 3.3 10*3/uL — ABNORMAL LOW (ref 3.9–10.3)
lymph#: 0.6 10*3/uL — ABNORMAL LOW (ref 0.9–3.3)

## 2011-02-08 ENCOUNTER — Encounter (HOSPITAL_BASED_OUTPATIENT_CLINIC_OR_DEPARTMENT_OTHER): Payer: MEDICARE | Admitting: Oncology

## 2011-02-08 DIAGNOSIS — Z5111 Encounter for antineoplastic chemotherapy: Secondary | ICD-10-CM

## 2011-02-08 DIAGNOSIS — C250 Malignant neoplasm of head of pancreas: Secondary | ICD-10-CM

## 2011-02-15 ENCOUNTER — Ambulatory Visit (HOSPITAL_COMMUNITY)
Admission: RE | Admit: 2011-02-15 | Discharge: 2011-02-15 | Disposition: A | Discharge status: Home / Self Care | Payer: MEDICARE | Source: Ambulatory Visit | Attending: Oncology | Admitting: Oncology

## 2011-02-15 ENCOUNTER — Encounter (HOSPITAL_BASED_OUTPATIENT_CLINIC_OR_DEPARTMENT_OTHER): Payer: MEDICARE | Admitting: Oncology

## 2011-02-15 ENCOUNTER — Other Ambulatory Visit: Payer: Self-pay | Admitting: Oncology

## 2011-02-15 DIAGNOSIS — R52 Pain, unspecified: Secondary | ICD-10-CM

## 2011-02-15 DIAGNOSIS — C801 Malignant (primary) neoplasm, unspecified: Secondary | ICD-10-CM

## 2011-02-15 DIAGNOSIS — E119 Type 2 diabetes mellitus without complications: Secondary | ICD-10-CM

## 2011-02-15 DIAGNOSIS — M25519 Pain in unspecified shoulder: Secondary | ICD-10-CM | POA: Insufficient documentation

## 2011-02-15 DIAGNOSIS — Z5111 Encounter for antineoplastic chemotherapy: Secondary | ICD-10-CM

## 2011-02-15 DIAGNOSIS — R7989 Other specified abnormal findings of blood chemistry: Secondary | ICD-10-CM

## 2011-02-15 DIAGNOSIS — M79609 Pain in unspecified limb: Secondary | ICD-10-CM | POA: Insufficient documentation

## 2011-02-15 DIAGNOSIS — D649 Anemia, unspecified: Secondary | ICD-10-CM

## 2011-02-15 DIAGNOSIS — M899 Disorder of bone, unspecified: Secondary | ICD-10-CM | POA: Insufficient documentation

## 2011-02-15 DIAGNOSIS — C7800 Secondary malignant neoplasm of unspecified lung: Secondary | ICD-10-CM

## 2011-02-15 DIAGNOSIS — C250 Malignant neoplasm of head of pancreas: Secondary | ICD-10-CM

## 2011-02-15 DIAGNOSIS — M949 Disorder of cartilage, unspecified: Secondary | ICD-10-CM | POA: Insufficient documentation

## 2011-02-15 LAB — CMP AND LIVER
Albumin: 3.3 g/dL — ABNORMAL LOW (ref 3.5–5.2)
Alkaline Phosphatase: 690 U/L — ABNORMAL HIGH (ref 39–117)
BUN: 11 mg/dL (ref 6–23)
Glucose, Bld: 67 mg/dL — ABNORMAL LOW (ref 70–99)
Indirect Bilirubin: 0.4 mg/dL (ref 0.0–0.9)
Potassium: 3.7 mEq/L (ref 3.5–5.3)
Total Bilirubin: 0.9 mg/dL (ref 0.3–1.2)

## 2011-02-15 LAB — CBC WITH DIFFERENTIAL/PLATELET
Basophils Absolute: 0 10*3/uL (ref 0.0–0.1)
Eosinophils Absolute: 0 10*3/uL (ref 0.0–0.5)
HCT: 31.7 % — ABNORMAL LOW (ref 34.8–46.6)
HGB: 10.8 g/dL — ABNORMAL LOW (ref 11.6–15.9)
LYMPH%: 15.9 % (ref 14.0–49.7)
MCV: 92.2 fL (ref 79.5–101.0)
MONO%: 9 % (ref 0.0–14.0)
NEUT#: 4.1 10*3/uL (ref 1.5–6.5)
Platelets: 119 10*3/uL — ABNORMAL LOW (ref 145–400)

## 2011-02-22 ENCOUNTER — Other Ambulatory Visit: Payer: Self-pay | Admitting: Oncology

## 2011-02-22 ENCOUNTER — Other Ambulatory Visit: Payer: Self-pay | Admitting: Physician Assistant

## 2011-02-22 ENCOUNTER — Encounter (HOSPITAL_BASED_OUTPATIENT_CLINIC_OR_DEPARTMENT_OTHER): Payer: MEDICARE | Admitting: Oncology

## 2011-02-22 DIAGNOSIS — R7989 Other specified abnormal findings of blood chemistry: Secondary | ICD-10-CM

## 2011-02-22 DIAGNOSIS — D649 Anemia, unspecified: Secondary | ICD-10-CM

## 2011-02-22 DIAGNOSIS — E119 Type 2 diabetes mellitus without complications: Secondary | ICD-10-CM

## 2011-02-22 DIAGNOSIS — C250 Malignant neoplasm of head of pancreas: Secondary | ICD-10-CM

## 2011-02-22 LAB — COMPREHENSIVE METABOLIC PANEL
ALT: 47 U/L — ABNORMAL HIGH (ref 0–35)
Albumin: 2.7 g/dL — ABNORMAL LOW (ref 3.5–5.2)
CO2: 29 mEq/L (ref 19–32)
Calcium: 8.8 mg/dL (ref 8.4–10.5)
Chloride: 101 mEq/L (ref 96–112)
Potassium: 4.3 mEq/L (ref 3.5–5.3)
Sodium: 135 mEq/L (ref 135–145)
Total Protein: 6.3 g/dL (ref 6.0–8.3)

## 2011-02-22 LAB — CBC WITH DIFFERENTIAL/PLATELET
BASO%: 0 % (ref 0.0–2.0)
HCT: 27.2 % — ABNORMAL LOW (ref 34.8–46.6)
MCHC: 34.7 g/dL (ref 31.5–36.0)
MONO#: 0.4 10*3/uL (ref 0.1–0.9)
NEUT%: 67.3 % (ref 38.4–76.8)
RDW: 14.7 % — ABNORMAL HIGH (ref 11.2–14.5)
WBC: 2.6 10*3/uL — ABNORMAL LOW (ref 3.9–10.3)
lymph#: 0.5 10*3/uL — ABNORMAL LOW (ref 0.9–3.3)

## 2011-02-25 ENCOUNTER — Emergency Department (HOSPITAL_COMMUNITY): Payer: MEDICARE

## 2011-02-25 ENCOUNTER — Inpatient Hospital Stay (HOSPITAL_COMMUNITY)
Admission: EM | Admit: 2011-02-25 | Discharge: 2011-03-01 | DRG: 435 | Disposition: A | Discharge status: Home / Self Care | Payer: MEDICARE | Attending: Oncology | Admitting: Oncology

## 2011-02-25 DIAGNOSIS — A419 Sepsis, unspecified organism: Secondary | ICD-10-CM | POA: Diagnosis present

## 2011-02-25 DIAGNOSIS — D630 Anemia in neoplastic disease: Secondary | ICD-10-CM | POA: Diagnosis present

## 2011-02-25 DIAGNOSIS — C259 Malignant neoplasm of pancreas, unspecified: Principal | ICD-10-CM | POA: Diagnosis present

## 2011-02-25 DIAGNOSIS — I10 Essential (primary) hypertension: Secondary | ICD-10-CM | POA: Diagnosis present

## 2011-02-25 DIAGNOSIS — E876 Hypokalemia: Secondary | ICD-10-CM | POA: Diagnosis present

## 2011-02-25 DIAGNOSIS — Z9889 Other specified postprocedural states: Secondary | ICD-10-CM

## 2011-02-25 DIAGNOSIS — R509 Fever, unspecified: Secondary | ICD-10-CM | POA: Diagnosis present

## 2011-02-25 DIAGNOSIS — E1169 Type 2 diabetes mellitus with other specified complication: Secondary | ICD-10-CM | POA: Diagnosis present

## 2011-02-25 DIAGNOSIS — E785 Hyperlipidemia, unspecified: Secondary | ICD-10-CM | POA: Diagnosis present

## 2011-02-25 DIAGNOSIS — R197 Diarrhea, unspecified: Secondary | ICD-10-CM | POA: Diagnosis present

## 2011-02-25 DIAGNOSIS — C787 Secondary malignant neoplasm of liver and intrahepatic bile duct: Secondary | ICD-10-CM | POA: Diagnosis present

## 2011-02-25 LAB — DIFFERENTIAL
Basophils Relative: 0 % (ref 0–1)
Eosinophils Absolute: 0 10*3/uL (ref 0.0–0.7)
Eosinophils Relative: 1 % (ref 0–5)
Neutrophils Relative %: 71 % (ref 43–77)

## 2011-02-25 LAB — COMPREHENSIVE METABOLIC PANEL
AST: 66 U/L — ABNORMAL HIGH (ref 0–37)
Albumin: 2.1 g/dL — ABNORMAL LOW (ref 3.5–5.2)
Alkaline Phosphatase: 570 U/L — ABNORMAL HIGH (ref 39–117)
Chloride: 102 mEq/L (ref 96–112)
Creatinine, Ser: <0.47 mg/dL (ref 0.4–1.2)
Potassium: 3.9 mEq/L (ref 3.5–5.1)
Total Bilirubin: 2.3 mg/dL — ABNORMAL HIGH (ref 0.3–1.2)
Total Protein: 5.7 g/dL — ABNORMAL LOW (ref 6.0–8.3)

## 2011-02-25 LAB — URINALYSIS, ROUTINE W REFLEX MICROSCOPIC
Glucose, UA: NEGATIVE mg/dL
Hgb urine dipstick: NEGATIVE
Specific Gravity, Urine: 1.016 (ref 1.005–1.030)
Urobilinogen, UA: 1 mg/dL (ref 0.0–1.0)

## 2011-02-25 LAB — CBC
Platelets: 99 10*3/uL — ABNORMAL LOW (ref 150–400)
RBC: 2.99 MIL/uL — ABNORMAL LOW (ref 3.87–5.11)
RDW: 14.8 % (ref 11.5–15.5)
WBC: 4 10*3/uL (ref 4.0–10.5)

## 2011-02-25 LAB — GLUCOSE, CAPILLARY

## 2011-02-26 ENCOUNTER — Inpatient Hospital Stay (HOSPITAL_COMMUNITY): Payer: MEDICARE

## 2011-02-26 DIAGNOSIS — A419 Sepsis, unspecified organism: Secondary | ICD-10-CM

## 2011-02-26 DIAGNOSIS — D649 Anemia, unspecified: Secondary | ICD-10-CM

## 2011-02-26 DIAGNOSIS — C259 Malignant neoplasm of pancreas, unspecified: Secondary | ICD-10-CM

## 2011-02-26 LAB — URINE CULTURE
Colony Count: 9000
Culture  Setup Time: 201205131728

## 2011-02-26 LAB — GLUCOSE, CAPILLARY
Glucose-Capillary: 217 mg/dL — ABNORMAL HIGH (ref 70–99)
Glucose-Capillary: 70 mg/dL (ref 70–99)
Glucose-Capillary: 184 mg/dL — ABNORMAL HIGH (ref 70–99)

## 2011-02-26 LAB — CBC
Hemoglobin: 8.8 g/dL — ABNORMAL LOW (ref 12.0–15.0)
MCH: 31.3 pg (ref 26.0–34.0)
Platelets: 139 10*3/uL — ABNORMAL LOW (ref 150–400)
RBC: 2.81 MIL/uL — ABNORMAL LOW (ref 3.87–5.11)
WBC: 3.7 10*3/uL — ABNORMAL LOW (ref 4.0–10.5)

## 2011-02-26 LAB — BASIC METABOLIC PANEL
CO2: 25 mEq/L (ref 19–32)
Calcium: 7.5 mg/dL — ABNORMAL LOW (ref 8.4–10.5)
Creatinine, Ser: <0.47 mg/dL (ref 0.4–1.2)
Sodium: 138 mEq/L (ref 135–145)

## 2011-02-26 MED ORDER — IOHEXOL 300 MG/ML  SOLN
100.0000 mL | Freq: Once | INTRAMUSCULAR | Status: AC | PRN
  Administered 2011-02-26: 100 mL via INTRAVENOUS

## 2011-02-27 LAB — BASIC METABOLIC PANEL
CO2: 27 mEq/L (ref 19–32)
Calcium: 7.7 mg/dL — ABNORMAL LOW (ref 8.4–10.5)
Chloride: 106 mEq/L (ref 96–112)
Potassium: 3 mEq/L — ABNORMAL LOW (ref 3.5–5.1)
Sodium: 139 mEq/L (ref 135–145)

## 2011-02-27 LAB — CBC
Hemoglobin: 11.5 g/dL — ABNORMAL LOW (ref 12.0–15.0)
RBC: 3.67 MIL/uL — ABNORMAL LOW (ref 3.87–5.11)
WBC: 3 10*3/uL — ABNORMAL LOW (ref 4.0–10.5)

## 2011-02-27 LAB — GLUCOSE, CAPILLARY
Glucose-Capillary: 63 mg/dL — ABNORMAL LOW (ref 70–99)
Glucose-Capillary: 97 mg/dL (ref 70–99)
Glucose-Capillary: 142 mg/dL — ABNORMAL HIGH (ref 70–99)
Glucose-Capillary: 147 mg/dL — ABNORMAL HIGH (ref 70–99)

## 2011-02-28 LAB — CROSSMATCH
Antibody Screen: NEGATIVE
Unit division: 0

## 2011-02-28 LAB — CBC
MCH: 30.6 pg (ref 26.0–34.0)
MCV: 90.5 fL (ref 78.0–100.0)
Platelets: 219 10*3/uL (ref 150–400)
RDW: 15.8 % — ABNORMAL HIGH (ref 11.5–15.5)
WBC: 3.1 10*3/uL — ABNORMAL LOW (ref 4.0–10.5)

## 2011-02-28 LAB — COMPREHENSIVE METABOLIC PANEL
Albumin: 2 g/dL — ABNORMAL LOW (ref 3.5–5.2)
BUN: 6 mg/dL (ref 6–23)
Creatinine, Ser: <0.47 mg/dL (ref 0.4–1.2)
Total Protein: 5.5 g/dL — ABNORMAL LOW (ref 6.0–8.3)

## 2011-03-01 LAB — GLUCOSE, CAPILLARY
Glucose-Capillary: 145 mg/dL — ABNORMAL HIGH (ref 70–99)
Glucose-Capillary: 113 mg/dL — ABNORMAL HIGH (ref 70–99)

## 2011-03-01 LAB — BASIC METABOLIC PANEL
BUN: 8 mg/dL (ref 6–23)
CO2: 28 mEq/L (ref 19–32)
Chloride: 104 mEq/L (ref 96–112)
Glucose, Bld: 110 mg/dL — ABNORMAL HIGH (ref 70–99)
Potassium: 3.2 mEq/L — ABNORMAL LOW (ref 3.5–5.1)

## 2011-03-01 LAB — CBC
HCT: 34.7 % — ABNORMAL LOW (ref 36.0–46.0)
Hemoglobin: 11.5 g/dL — ABNORMAL LOW (ref 12.0–15.0)
MCV: 92 fL (ref 78.0–100.0)
RBC: 3.77 MIL/uL — ABNORMAL LOW (ref 3.87–5.11)
WBC: 3.6 10*3/uL — ABNORMAL LOW (ref 4.0–10.5)

## 2011-03-03 LAB — CULTURE, BLOOD (ROUTINE X 2)
Culture  Setup Time: 201205131727
Culture  Setup Time: 201205131727
Culture: NO GROWTH
Culture: NO GROWTH

## 2011-03-08 ENCOUNTER — Other Ambulatory Visit: Payer: Self-pay | Admitting: Interventional Radiology

## 2011-03-08 DIAGNOSIS — R160 Hepatomegaly, not elsewhere classified: Secondary | ICD-10-CM

## 2011-03-20 ENCOUNTER — Ambulatory Visit
Admission: RE | Admit: 2011-03-20 | Discharge: 2011-03-20 | Disposition: A | Discharge status: Home / Self Care | Payer: Medicare Other | Source: Ambulatory Visit | Attending: Interventional Radiology | Admitting: Interventional Radiology

## 2011-03-20 VITALS — BP 133/62 | HR 60 | Temp 97.8°F | Resp 16 | Ht 62.0 in | Wt 97.7 lb

## 2011-03-20 DIAGNOSIS — R160 Hepatomegaly, not elsewhere classified: Secondary | ICD-10-CM

## 2011-03-26 ENCOUNTER — Other Ambulatory Visit: Payer: Self-pay | Admitting: Oncology

## 2011-03-26 ENCOUNTER — Encounter (HOSPITAL_BASED_OUTPATIENT_CLINIC_OR_DEPARTMENT_OTHER): Payer: Medicare Other | Admitting: Oncology

## 2011-03-26 DIAGNOSIS — C787 Secondary malignant neoplasm of liver and intrahepatic bile duct: Secondary | ICD-10-CM

## 2011-03-26 DIAGNOSIS — C250 Malignant neoplasm of head of pancreas: Secondary | ICD-10-CM

## 2011-03-26 DIAGNOSIS — C801 Malignant (primary) neoplasm, unspecified: Secondary | ICD-10-CM

## 2011-03-26 DIAGNOSIS — Z5111 Encounter for antineoplastic chemotherapy: Secondary | ICD-10-CM

## 2011-03-26 DIAGNOSIS — C7800 Secondary malignant neoplasm of unspecified lung: Secondary | ICD-10-CM

## 2011-03-26 LAB — CBC WITH DIFFERENTIAL/PLATELET
BASO%: 0.5 % (ref 0.0–2.0)
Basophils Absolute: 0 10*3/uL (ref 0.0–0.1)
EOS%: 3.2 % (ref 0.0–7.0)
HCT: 32 % — ABNORMAL LOW (ref 34.8–46.6)
HGB: 10.9 g/dL — ABNORMAL LOW (ref 11.6–15.9)
MCH: 32.3 pg (ref 25.1–34.0)
MCHC: 34.1 g/dL (ref 31.5–36.0)
MONO#: 0.6 10*3/uL (ref 0.1–0.9)
NEUT%: 69.3 % (ref 38.4–76.8)
RDW: 17.9 % — ABNORMAL HIGH (ref 11.2–14.5)
WBC: 5.1 10*3/uL (ref 3.9–10.3)
lymph#: 0.8 10*3/uL — ABNORMAL LOW (ref 0.9–3.3)

## 2011-03-26 LAB — COMPREHENSIVE METABOLIC PANEL
ALT: 70 U/L — ABNORMAL HIGH (ref 0–35)
AST: 78 U/L — ABNORMAL HIGH (ref 0–37)
Albumin: 2.9 g/dL — ABNORMAL LOW (ref 3.5–5.2)
CO2: 29 mEq/L (ref 19–32)
Calcium: 8.8 mg/dL (ref 8.4–10.5)
Chloride: 101 mEq/L (ref 96–112)
Potassium: 3.7 mEq/L (ref 3.5–5.3)

## 2011-03-27 LAB — CANCER ANTIGEN 19-9: CA 19-9: 43 U/mL — ABNORMAL HIGH (ref <35.0)

## 2011-03-29 ENCOUNTER — Encounter (HOSPITAL_BASED_OUTPATIENT_CLINIC_OR_DEPARTMENT_OTHER): Payer: Medicare Other | Admitting: Oncology

## 2011-03-29 DIAGNOSIS — C7800 Secondary malignant neoplasm of unspecified lung: Secondary | ICD-10-CM

## 2011-03-29 DIAGNOSIS — C250 Malignant neoplasm of head of pancreas: Secondary | ICD-10-CM

## 2011-03-29 DIAGNOSIS — C801 Malignant (primary) neoplasm, unspecified: Secondary | ICD-10-CM

## 2011-03-29 DIAGNOSIS — Z5111 Encounter for antineoplastic chemotherapy: Secondary | ICD-10-CM

## 2011-04-05 ENCOUNTER — Other Ambulatory Visit: Payer: Self-pay | Admitting: Oncology

## 2011-04-05 ENCOUNTER — Telehealth: Payer: Self-pay | Admitting: Radiology

## 2011-04-05 ENCOUNTER — Encounter (HOSPITAL_BASED_OUTPATIENT_CLINIC_OR_DEPARTMENT_OTHER): Payer: Medicare Other | Admitting: Oncology

## 2011-04-05 DIAGNOSIS — C250 Malignant neoplasm of head of pancreas: Secondary | ICD-10-CM

## 2011-04-05 DIAGNOSIS — C801 Malignant (primary) neoplasm, unspecified: Secondary | ICD-10-CM

## 2011-04-05 DIAGNOSIS — C7800 Secondary malignant neoplasm of unspecified lung: Secondary | ICD-10-CM

## 2011-04-05 DIAGNOSIS — C787 Secondary malignant neoplasm of liver and intrahepatic bile duct: Secondary | ICD-10-CM

## 2011-04-05 DIAGNOSIS — Z5111 Encounter for antineoplastic chemotherapy: Secondary | ICD-10-CM

## 2011-04-05 LAB — CBC WITH DIFFERENTIAL/PLATELET
BASO%: 0.6 % (ref 0.0–2.0)
Basophils Absolute: 0 10*3/uL (ref 0.0–0.1)
EOS%: 0.9 % (ref 0.0–7.0)
MCH: 31.3 pg (ref 25.1–34.0)
MCHC: 33.2 g/dL (ref 31.5–36.0)
MCV: 94.2 fL (ref 79.5–101.0)
MONO%: 14.9 % — ABNORMAL HIGH (ref 0.0–14.0)
RBC: 3.29 10*6/uL — ABNORMAL LOW (ref 3.70–5.45)
RDW: 16 % — ABNORMAL HIGH (ref 11.2–14.5)
lymph#: 1 10*3/uL (ref 0.9–3.3)
nRBC: 0 % (ref 0–0)

## 2011-04-11 ENCOUNTER — Other Ambulatory Visit: Payer: Self-pay | Admitting: Oncology

## 2011-04-11 ENCOUNTER — Encounter (HOSPITAL_BASED_OUTPATIENT_CLINIC_OR_DEPARTMENT_OTHER): Payer: Medicare Other | Admitting: Oncology

## 2011-04-11 DIAGNOSIS — C801 Malignant (primary) neoplasm, unspecified: Secondary | ICD-10-CM

## 2011-04-11 DIAGNOSIS — C250 Malignant neoplasm of head of pancreas: Secondary | ICD-10-CM

## 2011-04-11 DIAGNOSIS — Z5111 Encounter for antineoplastic chemotherapy: Secondary | ICD-10-CM

## 2011-04-11 DIAGNOSIS — C7800 Secondary malignant neoplasm of unspecified lung: Secondary | ICD-10-CM

## 2011-04-11 LAB — CBC WITH DIFFERENTIAL/PLATELET
BASO%: 0.4 % (ref 0.0–2.0)
LYMPH%: 17 % (ref 14.0–49.7)
MCHC: 33.7 g/dL (ref 31.5–36.0)
MONO#: 0.7 10*3/uL (ref 0.1–0.9)
NEUT#: 2.8 10*3/uL (ref 1.5–6.5)
Platelets: 118 10*3/uL — ABNORMAL LOW (ref 145–400)
RBC: 3.15 10*6/uL — ABNORMAL LOW (ref 3.70–5.45)
RDW: 16 % — ABNORMAL HIGH (ref 11.2–14.5)
WBC: 4.5 10*3/uL (ref 3.9–10.3)
nRBC: 0 % (ref 0–0)

## 2011-04-11 LAB — COMPREHENSIVE METABOLIC PANEL
ALT: 56 U/L — ABNORMAL HIGH (ref 0–35)
AST: 65 U/L — ABNORMAL HIGH (ref 0–37)
Calcium: 8.7 mg/dL (ref 8.4–10.5)
Chloride: 100 mEq/L (ref 96–112)
Creatinine, Ser: 0.45 mg/dL — ABNORMAL LOW (ref 0.50–1.10)
Sodium: 136 mEq/L (ref 135–145)
Total Bilirubin: 0.6 mg/dL (ref 0.3–1.2)
Total Protein: 6.3 g/dL (ref 6.0–8.3)

## 2011-04-12 NOTE — Discharge Summary (Signed)
  NAMEAIRIS, BARBEE NO.:  [id number]  MEDICAL RECORD NO.:  [id number]           PATIENT TYPE:  I  LOCATION:  1336                         FACILITY:  [facility]  PHYSICIAN:  Drue Second, M.D.     DATE OF BIRTH:  04-11-1931  DATE OF ADMISSION:  02/25/2011 DATE OF DISCHARGE:  03/01/2011                              DISCHARGE SUMMARY   DISCHARGE DIAGNOSES: 1. Metastatic pancreatic carcinoma. 2. Fevers and chills, resolved. 3. Sepsis, resolved. 4. Anemia, resolved. 5. Diabetes. 6. Chronic diarrhea.  HOSPITAL COURSE:  The patient was admitted to medical Oncology on Feb 25, 2011, presenting with fevers and chills.  She has had previous history of liver abscess and Klebsiella.  The patient was diagnosed with pancreatic cancer and underwent a Whipple procedure followed by chemoradiation.  She subsequently developed recurrence in her pancreas and had an RFA performed on November 24, 2010.  December 18, 2010, she developed chills and was found to have liver abscess that was treated with 4 weeks of IV Avelox.  On the day of admission, the patient complained of having fever to 101.  This was associated with chills. She was admitted through the emergency room to the oncology service. She was pancultured and started on IV antibiotic including Avelox.  All blood cultures up to date have remained negative.  She is now being discharged.  PAST MEDICAL HISTORY:  Metastatic pancreatic carcinoma, history of tonsillectomy, varicose vein stripping, appendectomy, bilateral tubal ligation, cholecystectomy, bilateral cataract extraction, type 2 diabetes, hyperlipidemia, hypertension and perihepatic abscess in 2012.  DISCHARGE MEDICATIONS:  Medications on discharge:  Avelox 400 mg IV. She is also going home on insulin and she will resume all of her home medications.  PHYSICAL EXAMINATION ON DISCHARGE:  GENERAL:  The patient is awake, alert and in no acute distress.  She appears  well. VITAL SIGNS:  Stable. LUNGS:  Clear bilaterally. CARDIOVASCULAR:  Regular rate and rhythm. ABDOMEN:  Soft, nontender, nondistended.  Bowel sounds are present.  No HSM. EXTREMITIES:  No edema. NEURO:  The patient is alert, oriented, otherwise nonfocal.  LABORATORY DATA ON DISCHARGE:  Sodium 136, potassium 3.2, chloride 104, bicarb 28, glucose 110, BUN 8 and creatinine less than 0.47.  CBC, white count 3.6, hemoglobin 11.5, hematocrit 34.7 and platelets are 274,000.  CONDITION ON DISCHARGE:  Stable.  FOLLOWUP:  The patient will follow up with Medical Oncology in about 1 to 2 weeks' time.  PHYSICAL ACTIVITY:  Physical activity can be resumed.  DIET:  Diet will be as she has been doing as an outpatient previously.     Drue Second, M.D.     KK/MEDQ  D:  03/01/2011  T:  03/01/2011  Job:  [id number]  Electronically Signed by Drue Second MD on 04/12/2011 07:00:40 PM

## 2011-04-12 NOTE — H&P (Signed)
NAME:  GREG, ECKRICH NO.:  [id number]  MEDICAL RECORD NO.:  [id number]           PATIENT TYPE:  E  LOCATION:  WLED                         FACILITY:  [facility]  PHYSICIAN:  Drue Second, M.D.     DATE OF BIRTH:  [birth year]  DATE OF ADMISSION:  02/25/2011 DATE OF DISCHARGE:                             HISTORY & PHYSICAL   REASON FOR ADMISSION:  The patient is a 77 year old female with fevers and chills with a history of pancreatic cancer and previous history of liver abscess and Klebsiella bacteremia.  HISTORY OF PRESENT ILLNESS:  The patient is a very pleasant 77 year old female with metastatic pancreatic carcinoma.  She originally underwent Whipple procedure and chemoradiation therapy.  Unfortunately she then had a recurrence in her liver of pancreatic cancer.  She underwent RFA on February 10 at [facility].  She thereafter developed fevers and chills and was found to have a liver abscess and required hospitalization to [facility] on December 18, 2010.  This was treated with 4 weeks of IV Avelox.  Initially the Avelox was started in the hospital and then she completed it at home.  The patient after having completed the treatment was again started on chemotherapy consisting of gemcitabine as a single agent.  Her last treatment was about 2 weeks ago.  This past week, the patient started having some chills off an on and low-grade fevers.  Yesterday she had a fever of 101.2 and this morning was also having chills and therefore the patient was brought into the [facility] Emergency Room for further evaluation.  Today she is having fevers.  She is also having some chills that she tells me but she has been eating well.  REVIEW OF SYSTEMS:  The patient denies any headaches, double vision, blurring of vision.  No difficulty in swallowing.  She has no shortness of breath, no chest pains, no palpitations, no abdominal pain.  She is denying any dysuria.  She has fevers at home  and chills.  No shaking rigors.  She has no peripheral paresthesias.  She denies having any diarrhea and remainder of the 14 point review of systems is negative.  PAST MEDICAL HISTORY:  Significant for pancreatic cancer which she had a Whipple procedure performed at [facility] on April 18, 2010.  She has also had a tonsillectomy, varicose vein stripping, appendectomy, bilateral tubal ligation, cholecystectomy and bilateral cataract extraction.  She is a type 2 diabetic and has hyperlipidemia, hypertension and history of perihepatic abscess in May 2011.  CURRENT MEDICATIONS:  Levemir 10 units in the morning.  ALLERGIES:  HYDROCODONE, HEPARIN, CONTRAST MEDIA, and AUGMENTIN.  SOCIAL HISTORY:  The patient is married.  She has 3 children.  She lives in [place].  She does not drink or smoke.  She does drink coffee. She is independent.  She lives with her husband.  She was a Theatre manager.  FAMILY HISTORY:  Asthma and Alzheimer's.  PHYSICAL EXAMINATION:  GENERAL:  The patient is awake, alert.  She appears well but thin female. VITAL SIGNS:  Temperature 98.2, pulse is 63, blood pressure 120/67, respirations 16. HEENT  EXAM:  EOMI, PERRLA.  Sclerae anicteric.  No conjunctival pallor. Oral mucosa is moist. NECK:  Supple. LUNGS: Clear. CARDIOVASCULAR:  Regular rate and rhythm. ABDOMEN:  Soft, nontender, no HSM. EXTREMITIES:  No edema. NEURO:  The patient is alert, oriented, otherwise nonfocal.  IMPRESSION AND PLAN:  The patient is a 77 year old female with metastatic pancreatic cancer.  The patient had a one met in her liver and she underwent RFA in February 2012.  She then developed liver abscess in March 2012 requiring prolonged course of antibiotics.  She is now being admitted with fever and chills for about 2 to 3 days duration. Differential does include recurrence of liver abscess versus a urinary tract infection.  Plan is to panculture including urinalysis, culture  and blood cultures. We will access her Port-A-Cath and draw blood cultures from that.  She will begin IV antibiotics including Avelox 400 mg daily.  I will set her up to get an ultrasound of the liver to look for possible occurrence of her liver abscess.  We will start her on IV fluids at 75 cc an hour. Insulin sliding scale as well as oncology standing orders have been added.  She hopefully will continue to do well.  All of this is discussed with the patient and her daughter and they are in agreement into coming into the hospital.     Drue Second, M.D.     KK/MEDQ  D:  02/25/2011  T:  02/25/2011  Job:  [id number]  Electronically Signed by Drue Second MD on 04/12/2011 07:00:35 PM

## 2011-04-16 ENCOUNTER — Emergency Department (HOSPITAL_COMMUNITY): Payer: Medicare Other

## 2011-04-16 ENCOUNTER — Inpatient Hospital Stay (HOSPITAL_COMMUNITY)
Admission: EM | Admit: 2011-04-16 | Discharge: 2011-04-20 | DRG: 808 | Disposition: A | Discharge status: Home Health | Payer: Medicare Other | Attending: Internal Medicine | Admitting: Internal Medicine

## 2011-04-16 DIAGNOSIS — R5081 Fever presenting with conditions classified elsewhere: Secondary | ICD-10-CM | POA: Diagnosis present

## 2011-04-16 DIAGNOSIS — I959 Hypotension, unspecified: Secondary | ICD-10-CM | POA: Diagnosis present

## 2011-04-16 DIAGNOSIS — E46 Unspecified protein-calorie malnutrition: Secondary | ICD-10-CM | POA: Diagnosis present

## 2011-04-16 DIAGNOSIS — K750 Abscess of liver: Secondary | ICD-10-CM | POA: Diagnosis present

## 2011-04-16 DIAGNOSIS — D61818 Other pancytopenia: Secondary | ICD-10-CM | POA: Diagnosis present

## 2011-04-16 DIAGNOSIS — R197 Diarrhea, unspecified: Secondary | ICD-10-CM | POA: Diagnosis present

## 2011-04-16 DIAGNOSIS — D709 Neutropenia, unspecified: Principal | ICD-10-CM | POA: Diagnosis present

## 2011-04-16 DIAGNOSIS — C259 Malignant neoplasm of pancreas, unspecified: Secondary | ICD-10-CM | POA: Diagnosis present

## 2011-04-16 DIAGNOSIS — E119 Type 2 diabetes mellitus without complications: Secondary | ICD-10-CM | POA: Diagnosis present

## 2011-04-16 DIAGNOSIS — Z9889 Other specified postprocedural states: Secondary | ICD-10-CM

## 2011-04-16 LAB — CBC
HCT: 27.1 % — ABNORMAL LOW (ref 36.0–46.0)
MCH: 32.1 pg (ref 26.0–34.0)
MCHC: 33.9 g/dL (ref 30.0–36.0)
MCV: 94.4 fL (ref 78.0–100.0)
RDW: 15.8 % — ABNORMAL HIGH (ref 11.5–15.5)

## 2011-04-16 LAB — COMPREHENSIVE METABOLIC PANEL
Albumin: 2.4 g/dL — ABNORMAL LOW (ref 3.5–5.2)
BUN: 14 mg/dL (ref 6–23)
Chloride: 101 mEq/L (ref 96–112)
Creatinine, Ser: 0.52 mg/dL (ref 0.50–1.10)
Total Bilirubin: 1 mg/dL (ref 0.3–1.2)
Total Protein: 5.8 g/dL — ABNORMAL LOW (ref 6.0–8.3)

## 2011-04-16 LAB — URINALYSIS, ROUTINE W REFLEX MICROSCOPIC
Bilirubin Urine: NEGATIVE
Glucose, UA: NEGATIVE mg/dL
Ketones, ur: NEGATIVE mg/dL
pH: 6.5 (ref 5.0–8.0)

## 2011-04-16 LAB — DIFFERENTIAL
Eosinophils Relative: 1 % (ref 0–5)
Lymphocytes Relative: 12 % (ref 12–46)
Lymphs Abs: 0.2 10*3/uL — ABNORMAL LOW (ref 0.7–4.0)
Monocytes Absolute: 0.1 10*3/uL (ref 0.1–1.0)

## 2011-04-17 ENCOUNTER — Encounter (HOSPITAL_COMMUNITY): Payer: Self-pay

## 2011-04-17 LAB — GLUCOSE, CAPILLARY
Glucose-Capillary: 103 mg/dL — ABNORMAL HIGH (ref 70–99)
Glucose-Capillary: 141 mg/dL — ABNORMAL HIGH (ref 70–99)
Glucose-Capillary: 110 mg/dL — ABNORMAL HIGH (ref 70–99)

## 2011-04-17 LAB — HEMOGLOBIN A1C: Hgb A1c MFr Bld: 6.7 % — ABNORMAL HIGH (ref <5.7)

## 2011-04-17 LAB — TSH: TSH: 5.366 u[IU]/mL — ABNORMAL HIGH (ref 0.350–4.500)

## 2011-04-17 LAB — URINE CULTURE

## 2011-04-17 MED ORDER — IOHEXOL 300 MG/ML  SOLN
100.0000 mL | Freq: Once | INTRAMUSCULAR | Status: AC | PRN
  Administered 2011-04-17: 100 mL via INTRAVENOUS

## 2011-04-18 DIAGNOSIS — D709 Neutropenia, unspecified: Secondary | ICD-10-CM

## 2011-04-18 DIAGNOSIS — R509 Fever, unspecified: Secondary | ICD-10-CM

## 2011-04-18 DIAGNOSIS — C259 Malignant neoplasm of pancreas, unspecified: Secondary | ICD-10-CM

## 2011-04-18 LAB — CBC
MCH: 31.5 pg (ref 26.0–34.0)
Platelets: 119 10*3/uL — ABNORMAL LOW (ref 150–400)
RBC: 2.98 MIL/uL — ABNORMAL LOW (ref 3.87–5.11)
WBC: 2.5 10*3/uL — ABNORMAL LOW (ref 4.0–10.5)

## 2011-04-18 LAB — DIFFERENTIAL
Basophils Absolute: 0 10*3/uL (ref 0.0–0.1)
Eosinophils Absolute: 0 10*3/uL (ref 0.0–0.7)
Lymphocytes Relative: 18 % (ref 12–46)
Neutro Abs: 1.8 10*3/uL (ref 1.7–7.7)
Neutrophils Relative %: 73 % (ref 43–77)

## 2011-04-18 LAB — COMPREHENSIVE METABOLIC PANEL
ALT: 70 U/L — ABNORMAL HIGH (ref 0–35)
AST: 74 U/L — ABNORMAL HIGH (ref 0–37)
CO2: 27 mEq/L (ref 19–32)
Calcium: 8 mg/dL — ABNORMAL LOW (ref 8.4–10.5)
Sodium: 141 mEq/L (ref 135–145)

## 2011-04-18 LAB — GLUCOSE, CAPILLARY
Glucose-Capillary: 137 mg/dL — ABNORMAL HIGH (ref 70–99)
Glucose-Capillary: 138 mg/dL — ABNORMAL HIGH (ref 70–99)

## 2011-04-19 LAB — GLUCOSE, CAPILLARY
Glucose-Capillary: 158 mg/dL — ABNORMAL HIGH (ref 70–99)
Glucose-Capillary: 176 mg/dL — ABNORMAL HIGH (ref 70–99)
Glucose-Capillary: 158 mg/dL — ABNORMAL HIGH (ref 70–99)

## 2011-04-19 LAB — COMPREHENSIVE METABOLIC PANEL
ALT: 50 U/L — ABNORMAL HIGH (ref 0–35)
Alkaline Phosphatase: 555 U/L — ABNORMAL HIGH (ref 39–117)
BUN: 5 mg/dL — ABNORMAL LOW (ref 6–23)
Chloride: 111 mEq/L (ref 96–112)
Glucose, Bld: 77 mg/dL (ref 70–99)
Potassium: 3.4 mEq/L — ABNORMAL LOW (ref 3.5–5.1)
Total Bilirubin: 0.6 mg/dL (ref 0.3–1.2)

## 2011-04-19 LAB — CBC
HCT: 24.9 % — ABNORMAL LOW (ref 36.0–46.0)
Hemoglobin: 8.7 g/dL — ABNORMAL LOW (ref 12.0–15.0)
RBC: 2.67 MIL/uL — ABNORMAL LOW (ref 3.87–5.11)
WBC: 2.5 10*3/uL — ABNORMAL LOW (ref 4.0–10.5)

## 2011-04-20 DIAGNOSIS — C259 Malignant neoplasm of pancreas, unspecified: Secondary | ICD-10-CM

## 2011-04-20 DIAGNOSIS — K750 Abscess of liver: Secondary | ICD-10-CM

## 2011-04-20 LAB — CBC
MCHC: 34.5 g/dL (ref 30.0–36.0)
MCV: 93.4 fL (ref 78.0–100.0)
Platelets: 95 10*3/uL — ABNORMAL LOW (ref 150–400)
RDW: 16.3 % — ABNORMAL HIGH (ref 11.5–15.5)
WBC: 2.4 10*3/uL — ABNORMAL LOW (ref 4.0–10.5)

## 2011-04-20 LAB — BASIC METABOLIC PANEL
Chloride: 108 mEq/L (ref 96–112)
Creatinine, Ser: <0.47 mg/dL — ABNORMAL LOW (ref 0.50–1.10)

## 2011-04-20 LAB — CLOSTRIDIUM DIFFICILE BY PCR: Toxigenic C. Difficile by PCR: NEGATIVE

## 2011-04-20 LAB — GLUCOSE, CAPILLARY: Glucose-Capillary: 100 mg/dL — ABNORMAL HIGH (ref 70–99)

## 2011-04-21 LAB — FECAL LACTOFERRIN, QUANT: Fecal Lactoferrin: NEGATIVE

## 2011-04-23 LAB — CULTURE, BLOOD (ROUTINE X 2)
Culture  Setup Time: 201207030221
Culture: NO GROWTH

## 2011-04-23 LAB — OVA AND PARASITE EXAMINATION

## 2011-04-25 NOTE — Consult Note (Signed)
[name] NO.:  [id number]  MEDICAL RECORD NO.:  [id number]  LOCATION:                                 FACILITY:  PHYSICIAN:  Currie Paris, M.D.DATE OF BIRTH:  May 19, 1931  DATE OF CONSULTATION:  04/20/2011 DATE OF DISCHARGE:                                CONSULTATION   TIME OF CONSULTATION:  14:30 p.m.  REQUESTING PHYSICIAN:  Dr. Antionette Char.  ONCOLOGIST:  Drue Second, M.D.  CONSULTING SURGEON:  Currie Paris, M.D.  REASON FOR CONSULTATION:  Pneumobilia.  HISTORY OF PRESENT ILLNESS:  [name] is a 77 year old white female with a past medical history including stage IV pancreatic cancer, chronic liver abscess, diabetes mellitus, hypertension, anemia of chronic disease, chronic diarrhea, who was diagnosed with pancreatic cancer last year.  She ultimately underwent a laparoscopic Whipple procedure at [facility].  The patient initially did well. She then underwent several rounds of chemotherapy.  A new lesion in her liver was found towards the beginning of 2012.  It was recommended that the patient receive radiofrequency ablation by Interventional Radiology to this lesion.  After that was done and in early March, the patient was admitted for fever.  She was found to have a perihepatic abscess.  This was percutaneously drained.  By the end of her hospitalization due to limited output from her drain, this was discontinued.  Since then, the patient has had another admission for fever.  It was likely thought to be related to a chronic liver abscess.  The patient has been following up with Dr. Irish Lack, of Interventional Radiology.  The last time she saw him was in May per the patient's daughter.  He felt that this time the liver abscess was mostly air and it was otherwise stable, and there was no need for further intervention at that time.  On the CT scan that she did have in May, she already did have some  pneumobilia present at that time.  Recently this past Monday, on July 2, the patient began having very low grade fever, but developed rigors.  They called the oncology doctors who recommended that the patient present to the emergency department.  Upon arrival, she then developed a fever of 101.3 and was admitted for febrile neutropenia.  A repeat CT scan was obtained as it was felt like her fevers were secondary to this chronic liver abscess.  Her CT scan revealed a persistent perihepatic abscess that has been previously drained.  There is air present in this area around the liver along with adjacent pneumobilia.  It was felt that there may be a communication between the biliary system and this liver abscess.  Because of this finding, we have been asked to evaluate the patient to see if there are any other recommendations from a surgical standpoint.  REVIEW OF SYSTEMS:  Please see HPI.  Otherwise, the patient denies any abdominal pain.  She is eating well and having bowel movements. Otherwise, all other systems have been reviewed are negative.  FAMILY HISTORY:  Noncontributory.  PAST MEDICAL HISTORY: 1. Stage IV pancreatic cancer. 2. Neutropenia. 3. Hypertension. 4. Diabetes mellitus. 5. Anemia of chronic  disease. 6. Chronic liver abscess status post percutaneous drain in March 2012.  PAST SURGICAL HISTORY: 1. Laparoscopic Whipple. 2. Tubal ligation. 3. Varicose vein stripping. 4. Tonsillectomy. 5. Possible appendectomy, although the patient is not sure whether she     actually had this done or not.  SOCIAL HISTORY:  The patient is married.  She has a daughter who I spoke with on the phone.  She denies any alcohol, tobacco, or illicit drug abuse.  ALLERGIES: 1. AUGMENTIN. 2. HEPARIN AGENTS. 3. HYDROCODONE. 4. LEVAQUIN.  MEDICATIONS AT HOME: 1. Chemotherapy regimen. 2. Ibuprofen. 3. EMLA. 4. Aspercreme. 5. Aleve. 6. MiraLax. 7. Acidophilus. 8. NovoLog  insulin. 9. Levemir. 10.Benadryl. 11.Flonase. 12.Vitamin D3. 13.Citracal plus vitamin D. 14.Aspirin. 15.Potassium chloride. 16.Creon.  PHYSICAL EXAMINATION:  GENERAL:  [name] is a very pleasant, very skinny, 77 year old white female who does appear to be jaundiced, but currently sitting up in a chair, in no acute distress. VITAL SIGNS:  Temperature 98.2, pulse 56, respirations 18, blood pressure 126/68. HEART:  Regular rate and rhythm.  Normal S1-S2.  No murmurs, gallops, or rubs are noted.  She does have palpable carotid, radial, and pedal pulses bilaterally. LUNGS:  Clear auscultation bilaterally with no wheezes, rhonchi, or rales noted.  Respiratory is nonlabored. ABDOMEN:  Soft, nontender, and nondistended with active bowel sounds. She does have several scars noted from her prior abdominal surgeries. MUSCULOSKELETAL:  All four extremities are symmetrical with no cyanosis or clubbing.  However, the patient does have some pitting edema in her bilateral lower extremities. PSYCH:  The patient is alert and oriented x3 with appropriate affect.  LABS AND DIAGNOSTIC:  Fetus is negative.  Sodium 140, potassium 3.5, glucose 113, BUN 7, creatinine 0.47.  White blood cell count of 2400, hemoglobin 8.8, hematocrit 25.5, platelet count is 95,000.  CT scan of the abdomen and pelvis reveals a previously drained abscess in the right lobe of the liver, which is of similar size to her last CT scan in mid May, however, there is no air present within the area of the liver and adjacent perihepatic ascites.  This may reflect a communication with the biliary system.  There is also pneumobilia noted.  She does have a large stool burden throughout the colon.  IMPRESSION: 1. Liver abscess, which is chronic. 2. Pneumobilia, which is possibly secondary to a connection to her     liver abscess versus an air from her Whipple procedure.  However,     this has been almost a year ago. 3. Stage IV  pancreatic cancer.  PLAN:  At this time, I have discussed with the patient, her husband, and her daughter, that unfortunately there is nothing surgically we can do to correct this problem.  I have discussed this patient with Dr. Jamey Ripa. We do not recommend any further percutaneous drainage of this liver abscess as this area is mostly air and would not benefit from a drain. This percutaneous drain will likely not help correct this problem anyway.  In the meantime, we would recommend symptomatically treating the patient as needed with antibiotics were Tylenol or any over-the- counter medicines as needed for fever if that arises.     Letha Cape, PA   ______________________________ Currie Paris, M.D.    KEO/MEDQ  D:  04/20/2011  T:  04/20/2011  Job:  [id number]  cc:   Drue Second, M.D. Fax: [phone]  [facility]  Dr. Antionette Char  Electronically Signed by Barnetta Chapel PA on 04/24/2011 10:38:28 AM  Electronically Signed by Cyndia Bent M.D. on 04/25/2011 11:39:43 AM

## 2011-05-03 NOTE — Discharge Summary (Signed)
NAMESHALAMAR, [name] NO.:  [id number]  MEDICAL RECORD NO.:  [id number]  LOCATION:  1304                         FACILITY:  [facility]  PHYSICIAN:  Ladell Pier, M.D.   DATE OF BIRTH:  15-Dec-1930  DATE OF ADMISSION:  04/16/2011 DATE OF DISCHARGE:  04/20/2011                              DISCHARGE SUMMARY   DISCHARGE DIAGNOSES: 1. Severe neutropenia. 2. Pancreatic cancer, diagnosed in 2011.  The patient underwent a     Whipple procedure at [facility] on April 18, 2010, with a recent     perihepatic abscess with klebsiella.  She has been treated     repeatedly with antibiotics including Avelox. 3. Liver abscess that is known.  Surgery to evaluate the pneumobilia     prior to being discharged. 4. Hypotension that is resolved. 5. Insulin-dependent diabetes. 6. Anemia of chronic disease. 7. Protein-calorie malnutrition. 8. Chronic diarrhea on and off, intermittent. 9. Status post tonsillectomy. 10.Status post appendectomy. 11.Status post varicose vein stripping. 12.Status post cholecystectomy. 13.Status post bilateral tubal ligation. 14.Status post bilateral cataract extractions.  DISCHARGE MEDICATIONS: 1. Avelox 400 mg daily for 10 days. 2. Protonix 40 mg daily. 3. Aspercreme topically as needed. 4. Aspirin 81 mg daily. 5. Benadryl 25 mg daily at bedtime. 6. Citracal daily. 7. Creon 1-2 capsules, 2 capsules with meals and 1 capsule with     snacks. 8. EMLA apply to port as needed. 9. Flonase nasal spray daily. 10.Ibuprofen 200 mg every 8 hours as needed. 11.Levemir 2 units every morning. 12.MiraLax 17 g daily as needed. 13.NovoLog 2 units before dinner if blood sugar is greater than 150. 14.Acidophilus saline. 15.Potassium chloride 20 mEq 3 times daily. 16.Vitamin D3 2000 units daily.  FOLLOWUP APPOINTMENTS:  The patient is to follow up with Dr. Welton Flakes.  The patient to call for appointment.  PROCEDURES: 1. CT scan of the abdomen and pelvis shows  previous drain abscess in     the right lobe of the liver is similar.  There is no air present within the area of the liver and in the adjacent perihepatic ascites. This will affect communication with biliary system. 1. Pneumobilia.  Perihepatic ascites and stable postop changes from     liver stable.  Large tube throughout the colon, small amount of     free fluid in the pelvis.  CONSULTANTS: 1. Drue Second, M.D., Oncology. 2. Almond Lint, MD, General Surgery.  HISTORY OF PRESENT ILLNESS:  The patient is a 77 year old female with a history of pancreatic cancer, status post Whipple procedure.  The patient was followed by chemoradiation.  The patient has reduced recurrence of her pancreatic cancer, had RSV performed in February 2012. She is back on chemo.  Her last chemo was only last week, Wednesday, which is 6 days ago.  She came in with another fever and chills, which she has had at least twice on December 18, 2010, and on Feb 25, 2011, all following chemo.  She is having fever up to 101 at home and also chills.  The patient recently had a liver abscess back in March.  She was treated with 4 weeks of IV Avelox.  She returned back again  in May 2012, which she was again treated with IV antibiotics including Avelox. She was sent home on Avelox, which she seemed to have tolerated well despite allergies to Levaquin.  She returned again today with the same complaint.  Past medical history, family history, social history, medicines, and allergies as per admission H and P.  PHYSICAL EXAMINATION:  VITAL SIGNS:  At the time of discharge, temperature 98.0, pulse 64, respirations 16, blood pressure 111/56, pulse oximetry 99% on room air. GENERAL:  The patient sitting up in chair, well-nourished white female. HEENT:  Normocephalic, atraumatic.  Pupils reactive to light without erythema. CARDIOVASCULAR:  Regular rate and rhythm. LUNGS:  Clear bilaterally. ABDOMEN:  Soft, nontender,  nondistended.  Positive bowel sounds. EXTREMITIES:  No edema.  HOSPITAL COURSE: 1. Neutropenic fever.  The patient was admitted to the hospital,     placed on broad-spectrum antibiotic per Dr. August Saucer; vanc and Avelox.     Over the course of the hospitalization, the fever totally resolved.     Discussed with her oncologist, Dr. Welton Flakes.  She is on Flagyl and     cefepime.  Dr. Welton Flakes suggested discontinuing the Flagyl and the     cefepime and to discharge her on the Avelox.  We will discharge her     on Avelox p.o.  The patient feels fine today.  We will also ask Dr.     Donell Beers, General Surgery, to evaluate her and review a CT scan to     make sure there is nothing further to do as far as her pneumobilia. 2. Hypokalemia.  She did have an episode of low potassium which was     repleted.  She is on home potassium for which she will be     discharged on her home potassium. 3. Diarrhea:  She did have a bout of diarrhea that has resolved on its     own.  She had stool studies that were sent, but the patient is now     constipated and not able to give stool samples. 4. Liver abscess has been seen before.  She will be sent home on p.o.     Avelox and that can be followed up outpatient between Surgery and     Oncology. 5. Anemia.  Her hemoglobin has been stable.  DISCHARGE LABORATORY DATA:  Sodium 140, potassium 3.5, chloride 108, CO2 of 28, glucose 130.  He has a stable creatinine of 0.47, WBC 2.4, hemoglobin 8.8, MCV 93.4 and platelets 95.  The patient was noted to have pancytopenia secondary to bone marrow suppression, but that is improving.  We will follow up with that with her. Pancytopenia.  She will follow up as an outpatient with Oncology.  She was evaluated by Hematology/Oncology as an inpatient.  TIME SPENT:  Time spent with patient in doing this discharge summary is approximately 45 minutes.     Ladell Pier, M.D.     N[place]/MEDQ  D:  04/20/2011  T:  04/20/2011  Job:   [id number]  Electronically Signed by Ladell Pier M.D. on 05/03/2011 11:01:10 PM

## 2011-05-10 ENCOUNTER — Telehealth: Payer: Self-pay | Admitting: Emergency Medicine

## 2011-05-10 NOTE — Telephone Encounter (Signed)
Telephone

## 2011-05-29 ENCOUNTER — Encounter (HOSPITAL_BASED_OUTPATIENT_CLINIC_OR_DEPARTMENT_OTHER): Payer: Medicare Other | Admitting: Oncology

## 2011-05-29 ENCOUNTER — Other Ambulatory Visit: Payer: Self-pay | Admitting: Oncology

## 2011-05-29 DIAGNOSIS — C7800 Secondary malignant neoplasm of unspecified lung: Secondary | ICD-10-CM

## 2011-05-29 DIAGNOSIS — C787 Secondary malignant neoplasm of liver and intrahepatic bile duct: Secondary | ICD-10-CM

## 2011-05-29 DIAGNOSIS — C801 Malignant (primary) neoplasm, unspecified: Secondary | ICD-10-CM

## 2011-05-29 DIAGNOSIS — C250 Malignant neoplasm of head of pancreas: Secondary | ICD-10-CM

## 2011-05-29 LAB — CBC WITH DIFFERENTIAL/PLATELET
BASO%: 0.5 % (ref 0.0–2.0)
Basophils Absolute: 0 10*3/uL (ref 0.0–0.1)
EOS%: 2.7 % (ref 0.0–7.0)
HCT: 32.3 % — ABNORMAL LOW (ref 34.8–46.6)
HGB: 10.6 g/dL — ABNORMAL LOW (ref 11.6–15.9)
LYMPH%: 8.7 % — ABNORMAL LOW (ref 14.0–49.7)
MCH: 32.4 pg (ref 25.1–34.0)
MCHC: 32.8 g/dL (ref 31.5–36.0)
MCV: 98.8 fL (ref 79.5–101.0)
MONO%: 11.1 % (ref 0.0–14.0)
NEUT%: 77 % — ABNORMAL HIGH (ref 38.4–76.8)
Platelets: 140 10*3/uL — ABNORMAL LOW (ref 145–400)

## 2011-05-29 LAB — COMPREHENSIVE METABOLIC PANEL
ALT: 66 U/L — ABNORMAL HIGH (ref 0–35)
AST: 89 U/L — ABNORMAL HIGH (ref 0–37)
BUN: 11 mg/dL (ref 6–23)
Calcium: 8.9 mg/dL (ref 8.4–10.5)
Creatinine, Ser: 0.57 mg/dL (ref 0.50–1.10)
Total Bilirubin: 0.9 mg/dL (ref 0.3–1.2)

## 2011-05-29 LAB — CANCER ANTIGEN 19-9: CA 19-9: 61.4 U/mL — ABNORMAL HIGH (ref <35.0)

## 2011-07-12 ENCOUNTER — Encounter (HOSPITAL_BASED_OUTPATIENT_CLINIC_OR_DEPARTMENT_OTHER): Payer: Medicare Other | Admitting: Oncology

## 2011-07-12 ENCOUNTER — Other Ambulatory Visit: Payer: Self-pay | Admitting: Oncology

## 2011-07-12 DIAGNOSIS — C787 Secondary malignant neoplasm of liver and intrahepatic bile duct: Secondary | ICD-10-CM

## 2011-07-12 DIAGNOSIS — C801 Malignant (primary) neoplasm, unspecified: Secondary | ICD-10-CM

## 2011-07-12 DIAGNOSIS — C250 Malignant neoplasm of head of pancreas: Secondary | ICD-10-CM

## 2011-07-12 DIAGNOSIS — C7800 Secondary malignant neoplasm of unspecified lung: Secondary | ICD-10-CM

## 2011-07-12 LAB — CBC WITH DIFFERENTIAL/PLATELET
Basophils Absolute: 0 10*3/uL (ref 0.0–0.1)
EOS%: 1.6 % (ref 0.0–7.0)
Eosinophils Absolute: 0.1 10*3/uL (ref 0.0–0.5)
HCT: 29.8 % — ABNORMAL LOW (ref 34.8–46.6)
HGB: 10.2 g/dL — ABNORMAL LOW (ref 11.6–15.9)
MCH: 33.9 pg (ref 25.1–34.0)
MCV: 99 fL (ref 79.5–101.0)
NEUT#: 2.3 10*3/uL (ref 1.5–6.5)
NEUT%: 65.2 % (ref 38.4–76.8)
RDW: 16.3 % — ABNORMAL HIGH (ref 11.2–14.5)
lymph#: 0.7 10*3/uL — ABNORMAL LOW (ref 0.9–3.3)

## 2011-07-12 LAB — CANCER ANTIGEN 19-9: CA 19-9: 125.8 U/mL — ABNORMAL HIGH (ref <35.0)

## 2011-07-12 LAB — COMPREHENSIVE METABOLIC PANEL
Albumin: 2.8 g/dL — ABNORMAL LOW (ref 3.5–5.2)
BUN: 9 mg/dL (ref 6–23)
Calcium: 8.1 mg/dL — ABNORMAL LOW (ref 8.4–10.5)
Chloride: 105 mEq/L (ref 96–112)
Creatinine, Ser: 0.63 mg/dL (ref 0.50–1.10)
Glucose, Bld: 152 mg/dL — ABNORMAL HIGH (ref 70–99)
Potassium: 3.8 mEq/L (ref 3.5–5.3)

## 2011-07-23 ENCOUNTER — Other Ambulatory Visit: Payer: Self-pay | Admitting: Interventional Radiology

## 2011-07-23 ENCOUNTER — Other Ambulatory Visit (HOSPITAL_COMMUNITY): Payer: Self-pay | Admitting: Interventional Radiology

## 2011-07-23 DIAGNOSIS — C787 Secondary malignant neoplasm of liver and intrahepatic bile duct: Secondary | ICD-10-CM

## 2011-07-23 DIAGNOSIS — C259 Malignant neoplasm of pancreas, unspecified: Secondary | ICD-10-CM

## 2011-07-24 ENCOUNTER — Emergency Department (HOSPITAL_COMMUNITY)
Admission: EM | Admit: 2011-07-24 | Discharge: 2011-07-24 | Disposition: A | Discharge status: Home / Self Care | Payer: Medicare Other | Attending: Emergency Medicine | Admitting: Emergency Medicine

## 2011-07-24 ENCOUNTER — Emergency Department (HOSPITAL_COMMUNITY): Payer: Medicare Other

## 2011-07-24 DIAGNOSIS — C259 Malignant neoplasm of pancreas, unspecified: Secondary | ICD-10-CM | POA: Insufficient documentation

## 2011-07-24 DIAGNOSIS — K219 Gastro-esophageal reflux disease without esophagitis: Secondary | ICD-10-CM | POA: Insufficient documentation

## 2011-07-24 DIAGNOSIS — E119 Type 2 diabetes mellitus without complications: Secondary | ICD-10-CM | POA: Insufficient documentation

## 2011-07-24 DIAGNOSIS — I10 Essential (primary) hypertension: Secondary | ICD-10-CM | POA: Insufficient documentation

## 2011-07-24 DIAGNOSIS — R079 Chest pain, unspecified: Secondary | ICD-10-CM | POA: Insufficient documentation

## 2011-07-24 LAB — GLUCOSE, CAPILLARY: Glucose-Capillary: 150 mg/dL — ABNORMAL HIGH (ref 70–99)

## 2011-07-25 ENCOUNTER — Encounter (HOSPITAL_COMMUNITY): Payer: Self-pay

## 2011-07-25 ENCOUNTER — Ambulatory Visit (HOSPITAL_COMMUNITY)
Admission: RE | Admit: 2011-07-25 | Discharge: 2011-07-25 | Disposition: A | Discharge status: Home / Self Care | Source: Ambulatory Visit | Attending: Interventional Radiology | Admitting: Interventional Radiology

## 2011-07-25 DIAGNOSIS — R188 Other ascites: Secondary | ICD-10-CM | POA: Insufficient documentation

## 2011-07-25 DIAGNOSIS — Z9221 Personal history of antineoplastic chemotherapy: Secondary | ICD-10-CM | POA: Insufficient documentation

## 2011-07-25 DIAGNOSIS — Z923 Personal history of irradiation: Secondary | ICD-10-CM | POA: Insufficient documentation

## 2011-07-25 DIAGNOSIS — C787 Secondary malignant neoplasm of liver and intrahepatic bile duct: Secondary | ICD-10-CM

## 2011-07-25 DIAGNOSIS — M949 Disorder of cartilage, unspecified: Secondary | ICD-10-CM | POA: Insufficient documentation

## 2011-07-25 DIAGNOSIS — C259 Malignant neoplasm of pancreas, unspecified: Secondary | ICD-10-CM | POA: Insufficient documentation

## 2011-07-25 DIAGNOSIS — M899 Disorder of bone, unspecified: Secondary | ICD-10-CM | POA: Insufficient documentation

## 2011-07-25 DIAGNOSIS — K7689 Other specified diseases of liver: Secondary | ICD-10-CM | POA: Insufficient documentation

## 2011-07-26 ENCOUNTER — Telehealth: Payer: Self-pay | Admitting: Emergency Medicine

## 2011-07-26 NOTE — Telephone Encounter (Addendum)
Message copied by Jari Sportsman on Thu Jul 26, 2011 12:49 PM  Spoke w/ Junious Dresser and they will come in the office to go over the CT with Dr Fredia Sorrow as scheduled on 08-01-11. ------      Message from: Irish Lack      Created: Thu Jul 26, 2011 12:24 PM      Regarding: RE: CT FROM YESTERDAY       Actually CT looks stable without new or growing lesions so I don't recommend any treatment from our standpoint.  Let daughter know.  I can talk to them over phone if they would like or they can come to office next week.                  ----- Message -----         From: Jari Sportsman         Sent: 07/26/2011  11:09 AM           To: Reola Calkins      Subject: CT FROM YESTERDAY                                        PT HAD CT YESTERDAY AFTERNOON.  COULD YOU REVIEW IT AND THE DAUGHTER WANTS TO KNOW; IF YOU CAN DO MORE TREATMENT THEN THEY WILL SEE YOU IN OFFICE, IF NOT, THEN THEY ARE NOT WILLING TO BRING Averi IN  TO HEAR THE BAD NEWS.                  THANKS,       Jamelia Varano

## 2011-07-28 NOTE — H&P (Signed)
NAMEMarland Small  RACQUELLE, HYSER NO.:  [id number]  MEDICAL RECORD NO.:  [id number]  LOCATION:  WLED                         FACILITY:  [facility]  PHYSICIAN:  Lonia Blood, M.D.      DATE OF BIRTH:  Jan 03, 1931  DATE OF ADMISSION:  04/16/2011 DATE OF DISCHARGE:                             HISTORY & PHYSICAL   PRIMARY CARE PHYSICIAN:  She sees Dr. Drue Second, her oncologist.  PRESENTING COMPLAINT:  Fever and chills, on chemo.  HISTORY OF PRESENT ILLNESS:  The patient is a 77 year old female with history of pancreatic cancer status post Whipple procedure.  This was followed by chemoradiation.  The patient had recurrence of her pancreatic cancer, had RFA performed in February of this year.  She is back on chemo.  Her last chemo was only last week, Wednesday, which is 6 days ago.  She came in with another fever and chills, which she has had at least twice, one on December 18, 2010 and other one on Feb 25, 2011; all following chemo.  She is having fever up to 101 at home and also chills. The patient has recently had liver abscess back in March.  She was treated with 4 weeks of IV Avelox.  She returned again back in May 2012 where she was again treated with IV antibiotics including the Avelox. She was sent home on Avelox, which she seemed to have tolerated well despite allergies to Levaquin.  She returned again today with the same complaint.  She denied any nausea, vomiting, or diarrhea, just generally weak.  Denied any pain.  She has been able to eat some food, but not adequately.  PAST MEDICAL HISTORY:  Significant for: 1. Pancreatic cancer, which seems to be metastatic.  She was diagnosed     last year and underwent Whipple procedure at [facility] back on     April 18, 2010.  She has had recent perihepatic abscess with     Klebsiella.  She has been treated repeatedly with antibiotics     including Avelox. 2. Hypertension. 3. Type 2 diabetes. 4. Anemia of chronic disease. 5.  Chronic diarrhea. 6. Status post tonsillectomy. 7. Status post appendectomy. 8. Status post varicose vein stripping. 9. Status post cholecystectomy. 10.Status post bilateral tubal ligation. 11.Status post bilateral cataract extraction. 12.History of recent pneumobilia.  ALLERGIES: 1. AUGMENTIN. 2. HEPARIN AGENTS. 3. HYDROCODONE. 4. LEVAQUIN.  CURRENT MEDICATIONS:  Include aspirin, Benadryl, Citracal plus D, Creon, fluticasone, Levemir, FlexPen, NovoLog, potassium chloride, and senna.  SOCIAL HISTORY:  [place]  She is married, with 2 children.  She is here with her daughter.  Denied any tobacco, alcohol, or IV drug use.  The patient is independent, although she lives with her husband.  She has recently worked as a Theatre manager.  FAMILY HISTORY:  Significant for Alzheimer's dementia and asthma.  REVIEW OF SYSTEMS:  All systems reviewed are negative except per HPI.  PHYSICAL EXAMINATION:  VITAL SIGNS:  Temperature 101.3 rectally, blood pressure 98/61 with a pulse of 74, respiratory rate 15, and sats 99% on room air. GENERAL:  She is chronically ill-looking, looks tired and exhausted, but in  no acute distress. HEENT:  PERRL.  EOMI.  No significant pallor.  No jaundice.  No rhinorrhea. NECK:  Supple.  No JVD.  No lymphadenopathy. RESPIRATORY:  Good air entry bilaterally.  No wheezes, no rales. CARDIOVASCULAR SYSTEM:  S1 and S2.  No murmur. ABDOMEN:  Soft, full, no tenderness.  No palpable organomegaly with multiple scars from previous surgery.  Positive bowel sounds. EXTREMITIES:  No edema, cyanosis, or clubbing. SKIN:  No significant rashes.  No ulcers. MUSCULOSKELETAL:  No joint swelling or tenderness.  LABORATORY DATA:  Sodium is 132, potassium 3.9, chloride of 101, CO2 of 25, glucose 214, BUN 14, creatinine of 0.52, total bilirubin is 1.0, alkaline phosphatase 769, AST of 148, ALT 88, total protein 5.8, albumin 2.4 with  calcium of 8.2.  White count is 1.5 with absolute neutrophils of 1.2, hemoglobin is 9.2 with platelet count of 157.  Her urinalysis is essentially negative.  CT abdomen and pelvis showed size of drained abscess in the right lobe of the liver still stable about the same size. It has air present within the area and the adjacent perihepatic ascites. There is question of possible communication with the biliary system, therefore, leading to pneumobilia.  There is perihepatic ascites, which is stable.  There is postoperative changes from Whipple's which is also stable.  She has large stool burden throughout the colon with small amount of free fluid in the pelvis.  Her chest x-ray shows no evidence of acute cardiopulmonary disease only chronic emphysematous changes.  ASSESSMENT:  This is a 77 year old female with metastatic pancreatic cancer, but also known history of hepatic abscess that has been drained and perihepatic abscess who is here with fever and pancytopenia.  Her neutrophils are borderline.  They are low, but not absolute neutropenia; however, she has had chemo just last week and has not probably fully recovered. 1. Febrile neutropenia.  This could be related to recurrent liver     abscess in the setting of her pancytopenia.  It could also be     classic febrile neutropenia with some organisms that are not     clearly known.  She had past history of Klebsiella, but this could     be other organisms as well.  We will therefore admit her, start her     on empiric antibiotics after drawing blood cultures and urine     cultures.  I will put her on cefepime, Flagyl, and add Avelox that     she has tolerated in the past.  We will narrow down her antibiotics     as she responds and hopefully we will get some cultures by then. 2. Pancreatic cancer.  We will consult her oncologist this morning.     She is due to have chemo again this Thursday, which may have to be     held. 3. Liver abscess.   This is known and it seems stable, however, this     could still be the source of her fever.  We will defer further     treatment again to her oncologist and GI. 4. Hypotension.  Blood pressure is stable, probably on the low side.     We will hydrate her aggressively and hold blood pressure medicines     for now until her blood pressure stabilizes. 5. Insulin-dependent diabetes.  I will put her on sliding scale     insulin and resume her Levemir. 6. Anemia of chronic disease.  We will follow her H and H  closely in     the hospital. 7. Protein-calorie malnutrition, probably due to her cancer with     decreased intake as well as liver involvement.  The patient will     need to increase protein intake and if needed we will get nutrition     consult for the patient.  Other than that further treatment will     depend on oncologist input.     Lonia Blood, M.D.     Verlin Grills  D:  04/17/2011  T:  04/17/2011  Job:  [id number]  Electronically Signed by Lonia Blood M.D. on 07/28/2011 02:39:38 PM

## 2011-08-01 ENCOUNTER — Ambulatory Visit
Admission: RE | Admit: 2011-08-01 | Discharge: 2011-08-01 | Disposition: A | Discharge status: Home / Self Care | Payer: Medicare Other | Source: Ambulatory Visit | Attending: Interventional Radiology | Admitting: Interventional Radiology

## 2011-08-01 DIAGNOSIS — C787 Secondary malignant neoplasm of liver and intrahepatic bile duct: Secondary | ICD-10-CM

## 2011-08-01 DIAGNOSIS — C259 Malignant neoplasm of pancreas, unspecified: Secondary | ICD-10-CM

## 2011-08-01 NOTE — Progress Notes (Signed)
Appetite good.  Has gained 3.5 lbs.  Denies n, v, or diarrhea.  Occasional bloating and indigestion.  Denies pain.    Sleeping fair.  [place] 2 miles[place] at the mall[place]  Endurance fair.  Next f/u appointment w/ Dr. Welton Flakes to be scheduled Jan 2013.

## 2011-08-22 ENCOUNTER — Telehealth: Payer: Self-pay | Admitting: *Deleted

## 2011-08-22 ENCOUNTER — Other Ambulatory Visit: Payer: Self-pay | Admitting: Oncology

## 2011-08-22 NOTE — Telephone Encounter (Signed)
Marie from Hospice called states "  Necha Harries had an episode of chills yesterday 08/21/11, these usually precede hospitalizations with infections. Junious Dresser and I would like her to have Avelox called and on hand in case she has chills again. She uses [facility] on [address],[place] [phone]. I have also collected a urine  On her for UA/Culture." Please advise

## 2011-08-23 ENCOUNTER — Encounter: Payer: Self-pay | Admitting: *Deleted

## 2011-08-24 NOTE — Telephone Encounter (Signed)
This encounter was created in error - please disregard.

## 2011-09-13 ENCOUNTER — Telehealth: Payer: Self-pay | Admitting: *Deleted

## 2011-09-13 ENCOUNTER — Telehealth: Payer: Self-pay | Admitting: Oncology

## 2011-09-13 ENCOUNTER — Other Ambulatory Visit: Payer: Self-pay | Admitting: Oncology

## 2011-09-13 DIAGNOSIS — C259 Malignant neoplasm of pancreas, unspecified: Secondary | ICD-10-CM

## 2011-09-13 NOTE — Telephone Encounter (Signed)
Per md, notified daughter that MD would be seeing pt next week 09/18/11. Also notified hospice nurse "marie" [phone] that per md aldactone 25mg  daily/prn.

## 2011-09-13 NOTE — Telephone Encounter (Signed)
LEFT VOICE MESSAGE TO INFORM THE PATIENT OF THE NEW DATE AND TIME OF THE NEW APPOINTMENT 09-18-2011 STARTING AT 12:30

## 2011-09-13 NOTE — Telephone Encounter (Signed)
I did not renew her authorization for Gemzar as she had her last one on 04/11/11.  Last authorization expired on 08/27/11.

## 2011-09-18 ENCOUNTER — Ambulatory Visit (HOSPITAL_BASED_OUTPATIENT_CLINIC_OR_DEPARTMENT_OTHER): Payer: Medicare Other | Admitting: Oncology

## 2011-09-18 ENCOUNTER — Other Ambulatory Visit (HOSPITAL_BASED_OUTPATIENT_CLINIC_OR_DEPARTMENT_OTHER): Payer: Medicare Other | Admitting: Lab

## 2011-09-18 ENCOUNTER — Telehealth: Payer: Self-pay | Admitting: Oncology

## 2011-09-18 VITALS — BP 135/71 | HR 78 | Temp 98.0°F | Ht 62.0 in | Wt 101.3 lb

## 2011-09-18 DIAGNOSIS — R5381 Other malaise: Secondary | ICD-10-CM

## 2011-09-18 DIAGNOSIS — R5383 Other fatigue: Secondary | ICD-10-CM

## 2011-09-18 DIAGNOSIS — Z8639 Personal history of other endocrine, nutritional and metabolic disease: Secondary | ICD-10-CM

## 2011-09-18 DIAGNOSIS — C250 Malignant neoplasm of head of pancreas: Secondary | ICD-10-CM

## 2011-09-18 DIAGNOSIS — C787 Secondary malignant neoplasm of liver and intrahepatic bile duct: Secondary | ICD-10-CM

## 2011-09-18 DIAGNOSIS — M7989 Other specified soft tissue disorders: Secondary | ICD-10-CM

## 2011-09-18 DIAGNOSIS — C259 Malignant neoplasm of pancreas, unspecified: Secondary | ICD-10-CM

## 2011-09-18 LAB — COMPREHENSIVE METABOLIC PANEL
ALT: 29 U/L (ref 0–35)
Albumin: 2.7 g/dL — ABNORMAL LOW (ref 3.5–5.2)
CO2: 28 mEq/L (ref 19–32)
Chloride: 103 mEq/L (ref 96–112)
Potassium: 4.2 mEq/L (ref 3.5–5.3)
Sodium: 135 mEq/L (ref 135–145)
Total Bilirubin: 2.1 mg/dL — ABNORMAL HIGH (ref 0.3–1.2)
Total Protein: 6 g/dL (ref 6.0–8.3)

## 2011-09-18 LAB — CBC WITH DIFFERENTIAL/PLATELET
BASO%: 0.2 % (ref 0.0–2.0)
Eosinophils Absolute: 0 10*3/uL (ref 0.0–0.5)
LYMPH%: 14.4 % (ref 14.0–49.7)
MCHC: 33.5 g/dL (ref 31.5–36.0)
MONO#: 0.7 10*3/uL (ref 0.1–0.9)
NEUT#: 3.8 10*3/uL (ref 1.5–6.5)
RBC: 3.28 10*6/uL — ABNORMAL LOW (ref 3.70–5.45)
RDW: 17.1 % — ABNORMAL HIGH (ref 11.2–14.5)
WBC: 5.3 10*3/uL (ref 3.9–10.3)
lymph#: 0.8 10*3/uL — ABNORMAL LOW (ref 0.9–3.3)

## 2011-09-18 NOTE — Progress Notes (Signed)
OFFICE PROGRESS NOTE    Wonda Cheng, MD [address]   [place] [address]  DIAGNOSIS: 77 year old female with metastatic carcinoma with liver metastasis   CURRENT THERAPY: Patient is currently on hospice  INTERVAL HISTORY: [name] 78 y.o. female returns for followup visit. We had received a call from hospice stating that the patient had right lower extremity swelling and abdominal swelling. She was also complaining of right shoulder pain. Was very weak and tired and fatigued. Cause of this I called the patient to the office for a followup. On review patient states that her right lower extremity started swelling a few weeks ago. She started on Aldactone which is now hoping the swelling in her abdomen as well as the lower extremities. She has no nausea or vomiting. She previously did have some chills that we did call in Cipro 500 mg. As you recall she has a history of liver abscess.  MEDICAL HISTORY: Past Medical History  Diagnosis Date  . Diabetes mellitus   . Pancreatic cancer     pancreatic ca dx 4/11    ALLERGIES:  is allergic to augmentin; heparin; hydrocodone; levaquin; and vicodin.  MEDICATIONS:  I reviewed and they are recorded separately in the electronic medical record.  SURGICAL HISTORY: No past surgical history on file.  REVIEW OF SYSTEMS:  Constitutional: positive for chills, fatigue and weight loss Eyes: negative Ears, nose, mouth, throat, and face: negative Respiratory: negative Cardiovascular: negative Gastrointestinal: positive for Abdominal distention with shifting dullness and fluid thrill Genitourinary:negative Neurological: negative   PHYSICAL EXAMINATION: General appearance: alert, cooperative and appears stated age Head: Normocephalic, without obvious abnormality, atraumatic Neck: no adenopathy, no carotid bruit, no JVD, supple, symmetrical, trachea midline and thyroid not enlarged, symmetric, no  tenderness/mass/nodules Lymph nodes: Cervical, supraclavicular, and axillary nodes normal. Resp: clear to auscultation bilaterally and normal percussion bilaterally Back: symmetric, no curvature. ROM normal. No CVA tenderness. Cardio: regular rate and rhythm, S1, S2 normal, no murmur, click, rub or gallop GI: abnormal findings:  ascites, distended, obese and shifting dullness Extremities: edema Of the right extremity well is the left lower extremity but the right is greater than the left. There is no tenderness. Neurologic: Alert and oriented X 3, normal strength and tone. Normal symmetric reflexes. Normal coordination and gait  ECOG PERFORMANCE STATUS: 1 - Symptomatic but completely ambulatory  Blood pressure 135/71, pulse 78, temperature 98 F (36.7 C), temperature source Oral, height 5\' 2"  (1.575 m), weight 101 lb 4.8 oz (45.949 kg).  LABORATORY DATA: Lab Results  Component Value Date   WBC 5.3 09/18/2011   HGB 10.6* 09/18/2011   HCT 31.6* 09/18/2011   MCV 96.3 09/18/2011   PLT 120* 09/18/2011      Chemistry      Component Value Date/Time   NA 141 07/12/2011 1230   NA 141 07/12/2011 1230   NA 141 08/23/2010 1404   K 3.8 07/12/2011 1230   K 3.8 07/12/2011 1230   K 4.5 08/23/2010 1404   CL 105 07/12/2011 1230   CL 105 07/12/2011 1230   CL 100 08/23/2010 1404   CO2 27 07/12/2011 1230   CO2 27 07/12/2011 1230   CO2 30 08/23/2010 1404   BUN 9 07/12/2011 1230   BUN 9 07/12/2011 1230   BUN 11 08/23/2010 1404   CREATININE 0.63 07/12/2011 1230   CREATININE 0.63 07/12/2011 1230   CREATININE 0.6 08/23/2010 1404      Component Value Date/Time   CALCIUM 8.1* 07/12/2011 1230  CALCIUM 8.1* 07/12/2011 1230   CALCIUM 9.6 08/23/2010 1404   ALKPHOS 771* 07/12/2011 1230   ALKPHOS 771* 07/12/2011 1230   ALKPHOS 375* 08/23/2010 1404   AST 71* 07/12/2011 1230   AST 71* 07/12/2011 1230   AST 66* 08/23/2010 1404   ALT 41* 07/12/2011 1230   ALT 41* 07/12/2011 1230   BILITOT 1.1 07/12/2011 1230   BILITOT 1.1  07/12/2011 1230   BILITOT 0.70 08/23/2010 1404       RADIOGRAPHIC STUDIES:  No results found.  ASSESSMENT: 77 year old female with metastatic pancreatic carcinoma. Patient previously has had a complete resection performed at [facility]. She then received adjuvant treatments. Unfortunately she had progression of disease to the liver. She had an RFA performed. She also has a history of a liver abscess. She does get frequent bacteremias. We have now been treating her palliatively. We discontinued her chemotherapy several months ago. In spite of that patient has done quite well. Patient now with right lower extremity swelling as well as abdominal distention. Physical exam is suspicious for possible right DVT as well as development of ascites.   PLAN: I will obtain a Doppler ultrasound of the bilateral lower extremities to rule out a DVT. We will also get an abdominal ultrasound to rule out ascites. She has significant ascites then she will need a paracentesis. She will continue the Aldactone. I have set up the patient to see me in one month's time. We will call her with the results of her scans.   All questions were answered. The patient knows to call the clinic with any problems, questions or concerns. We can certainly see the patient much sooner if necessary.  I spent 20 minutes counseling the patient face to face. The total time spent in the appointment was 30 minutes.    Drue Second, MD Medical/Oncology [facility] [phone] (beeper) [phone] (Office)  09/18/2011, 4:49 PM

## 2011-09-18 NOTE — Telephone Encounter (Signed)
Gv pt appt for jan2013.  scheduled pt for u/s abdomen for 12/05 @ 9am @ [facility] and doppler 12/05 @ 11am @ [facility]

## 2011-09-19 ENCOUNTER — Other Ambulatory Visit (HOSPITAL_COMMUNITY): Payer: Self-pay | Admitting: *Deleted

## 2011-09-19 ENCOUNTER — Ambulatory Visit (HOSPITAL_COMMUNITY)
Admission: RE | Admit: 2011-09-19 | Discharge: 2011-09-19 | Disposition: A | Discharge status: Home / Self Care | Source: Ambulatory Visit | Attending: Oncology | Admitting: Oncology

## 2011-09-19 DIAGNOSIS — M7989 Other specified soft tissue disorders: Secondary | ICD-10-CM | POA: Insufficient documentation

## 2011-09-19 DIAGNOSIS — Z8639 Personal history of other endocrine, nutritional and metabolic disease: Secondary | ICD-10-CM

## 2011-09-19 DIAGNOSIS — Z862 Personal history of diseases of the blood and blood-forming organs and certain disorders involving the immune mechanism: Secondary | ICD-10-CM

## 2011-09-19 DIAGNOSIS — R1900 Intra-abdominal and pelvic swelling, mass and lump, unspecified site: Secondary | ICD-10-CM | POA: Insufficient documentation

## 2011-09-19 DIAGNOSIS — Z9089 Acquired absence of other organs: Secondary | ICD-10-CM | POA: Insufficient documentation

## 2011-09-19 DIAGNOSIS — R188 Other ascites: Secondary | ICD-10-CM | POA: Insufficient documentation

## 2011-09-19 DIAGNOSIS — Q619 Cystic kidney disease, unspecified: Secondary | ICD-10-CM | POA: Insufficient documentation

## 2011-09-19 DIAGNOSIS — C259 Malignant neoplasm of pancreas, unspecified: Secondary | ICD-10-CM | POA: Insufficient documentation

## 2011-09-19 NOTE — Progress Notes (Signed)
PRELIMINARY  PRELIMINARY  PRELIMINARY  PRELIMINARY  Bilateral lower extremity venous duplex completed.    Preliminary report:  Bilateral:  No evidence of DVT or Baker's Cyst.   Terance Hart, RVT 09/19/2011 10:26 AM

## 2011-09-20 ENCOUNTER — Telehealth: Payer: Self-pay | Admitting: *Deleted

## 2011-09-20 NOTE — Telephone Encounter (Signed)
Message copied by Cooper Render on Thu Sep 20, 2011  8:59 AM ------      Message from: Victorino December      Created: Wed Sep 19, 2011  7:26 PM       Call patient: ultrasound shows a little bit of fluid in abdomen, no need to do anything currently

## 2011-09-20 NOTE — Telephone Encounter (Signed)
Notified [name]- pt's daughter.     Attached to     LOWER EXTREMITY VENOUS DUPLEX (Order# [id number])    Result Notes     Notes Recorded by Victorino December, MD on 09/19/2011 at 7:24 PM Call patient: no blood clot seen

## 2011-09-24 ENCOUNTER — Other Ambulatory Visit: Payer: Self-pay | Admitting: Certified Registered Nurse Anesthetist

## 2011-09-24 ENCOUNTER — Telehealth: Payer: Self-pay | Admitting: Certified Registered Nurse Anesthetist

## 2011-09-24 NOTE — Telephone Encounter (Signed)
Junious Dresser (daughter) called inquiring result for CA19-9. Verified with Dr. Welton Flakes, CA 19-9 was not done during last visit.    Daughter notified of above and requested to have pt's lab faxed to [phone].  Per daughter, pt. Also started back with Avelox on Saturday due to "rigor" symptoms , no fever noted.  Daughter will notify hospice nurse today.  Daughter requested to have CA19-9 done at pt's January visit. Will relay this to Dr. Welton Flakes. Once lab reviewed by Dr. Welton Flakes, will mail to patient.

## 2011-09-25 ENCOUNTER — Telehealth: Payer: Self-pay | Admitting: *Deleted

## 2011-09-25 NOTE — Telephone Encounter (Signed)
Spoke with Junious Dresser 12/11 concerning labs. Faxed to # provided. Junious Dresser called advised she had received labs. Junious Dresser has requested for CA 19.9 to be drawn, she has contacted Hospice RN who states she could draw the lab and send to [facility]. Per Junious Dresser, pt would like to do an ablation like the last time if the CA 19.9 has increased. Reviewed concerns with MD. Randol Kern at hm# [phone] left msg Dr. Welton Flakes is okay with Hospice drawing lab and sending to [facility]." Unable to reach [employer] at work as the office is closed from [employer].

## 2011-09-27 ENCOUNTER — Telehealth: Payer: Self-pay | Admitting: *Deleted

## 2011-09-27 NOTE — Telephone Encounter (Signed)
Randol Kern per MD - Repeat CA 19.9 in 1 month and MD will send Referral to Dr. Fredia Sorrow for Ablation per Junious Dresser & pt's request.

## 2011-10-03 ENCOUNTER — Other Ambulatory Visit (HOSPITAL_COMMUNITY): Payer: Self-pay | Admitting: Interventional Radiology

## 2011-10-03 DIAGNOSIS — C259 Malignant neoplasm of pancreas, unspecified: Secondary | ICD-10-CM

## 2011-10-04 ENCOUNTER — Telehealth: Payer: Self-pay | Admitting: Emergency Medicine

## 2011-10-04 ENCOUNTER — Encounter: Payer: Self-pay | Admitting: Oncology

## 2011-10-04 ENCOUNTER — Other Ambulatory Visit: Payer: Self-pay | Admitting: Interventional Radiology

## 2011-10-04 DIAGNOSIS — C259 Malignant neoplasm of pancreas, unspecified: Secondary | ICD-10-CM

## 2011-10-04 NOTE — Telephone Encounter (Signed)
CONFIRMED CT APPT AND VISIT W/ DR Lanier Prude W/ DAUGHTER - CONNIE CT- [facility] ON 10-11-11 AT 1215PM VISIT- 10-17-11 AT 900AM

## 2011-10-05 ENCOUNTER — Telehealth: Payer: Self-pay | Admitting: *Deleted

## 2011-10-05 NOTE — Telephone Encounter (Signed)
French Ana, Triage RN from Hospice called pt reports rigors/chills. Requested Rx for Avelox.  Reviewed with MD- Ok for Avelox 400mg  1  PO x 15days.  Tracy confirmed/readback veral order.

## 2011-10-10 ENCOUNTER — Other Ambulatory Visit: Payer: Self-pay | Admitting: *Deleted

## 2011-10-10 ENCOUNTER — Telehealth: Payer: Self-pay | Admitting: *Deleted

## 2011-10-10 DIAGNOSIS — C259 Malignant neoplasm of pancreas, unspecified: Secondary | ICD-10-CM

## 2011-10-10 NOTE — Telephone Encounter (Signed)
Per call dtr states concern per hospice request for films is to associate pain with new mets or possible fracture

## 2011-10-10 NOTE — Telephone Encounter (Signed)
[name] daughter called reporting patient is having pain to her hip, back and shoulder.  Patient has an abd/pelvis CT tomorrow and Hospice would like these areas xrayed.  [name] lives in [place] and it is hard for her to come to [place].  Reports pain is new.  Would like on -call MD to call her if necessary.  Daughter aware Dr. Welton Flakes to return to office 10-15-11.

## 2011-10-11 ENCOUNTER — Ambulatory Visit (HOSPITAL_COMMUNITY)
Admission: RE | Admit: 2011-10-11 | Discharge: 2011-10-11 | Disposition: A | Discharge status: Home / Self Care | Source: Ambulatory Visit | Attending: Interventional Radiology | Admitting: Interventional Radiology

## 2011-10-11 DIAGNOSIS — K7689 Other specified diseases of liver: Secondary | ICD-10-CM | POA: Insufficient documentation

## 2011-10-11 DIAGNOSIS — Q619 Cystic kidney disease, unspecified: Secondary | ICD-10-CM | POA: Insufficient documentation

## 2011-10-11 DIAGNOSIS — M47814 Spondylosis without myelopathy or radiculopathy, thoracic region: Secondary | ICD-10-CM | POA: Insufficient documentation

## 2011-10-11 DIAGNOSIS — C259 Malignant neoplasm of pancreas, unspecified: Secondary | ICD-10-CM | POA: Insufficient documentation

## 2011-10-11 DIAGNOSIS — C787 Secondary malignant neoplasm of liver and intrahepatic bile duct: Secondary | ICD-10-CM | POA: Insufficient documentation

## 2011-10-11 MED ORDER — IOHEXOL 300 MG/ML  SOLN
100.0000 mL | Freq: Once | INTRAMUSCULAR | Status: AC | PRN
  Administered 2011-10-11: 100 mL via INTRAVENOUS

## 2011-10-17 ENCOUNTER — Ambulatory Visit
Admission: RE | Admit: 2011-10-17 | Discharge: 2011-10-17 | Disposition: A | Discharge status: Home / Self Care | Payer: Medicare Other | Source: Ambulatory Visit | Attending: Interventional Radiology | Admitting: Interventional Radiology

## 2011-10-17 VITALS — BP 137/91 | HR 57 | Temp 97.8°F | Resp 12

## 2011-10-17 DIAGNOSIS — C259 Malignant neoplasm of pancreas, unspecified: Secondary | ICD-10-CM

## 2011-10-17 NOTE — Progress Notes (Signed)
PT TWISTED HER BACK ON CHRISTMAS EVE, HAS HAD A COUPLE OF EPISODES OF RICKERS, VOMITED X2 ON 10-07-11- WHICH WAS BLACKISH IN COLOR, BOWELS ARE ABOUT NORMAL FOR BEING ON THE AVALOX.    NO CHEMO  PT IS SEEIN DR KHAN ON 10-19-11.

## 2011-10-19 ENCOUNTER — Ambulatory Visit (HOSPITAL_BASED_OUTPATIENT_CLINIC_OR_DEPARTMENT_OTHER): Payer: Medicare Other | Admitting: Oncology

## 2011-10-19 ENCOUNTER — Other Ambulatory Visit: Payer: Medicare Other

## 2011-10-19 ENCOUNTER — Other Ambulatory Visit: Payer: Self-pay | Admitting: Oncology

## 2011-10-19 VITALS — BP 138/73 | HR 65 | Temp 98.1°F | Ht 62.0 in | Wt 90.7 lb

## 2011-10-19 DIAGNOSIS — Z9109 Other allergy status, other than to drugs and biological substances: Secondary | ICD-10-CM

## 2011-10-19 DIAGNOSIS — C259 Malignant neoplasm of pancreas, unspecified: Secondary | ICD-10-CM

## 2011-10-19 DIAGNOSIS — C787 Secondary malignant neoplasm of liver and intrahepatic bile duct: Secondary | ICD-10-CM

## 2011-10-19 LAB — CBC WITH DIFFERENTIAL/PLATELET
EOS%: 1 % (ref 0.0–7.0)
Eosinophils Absolute: 0 10*3/uL (ref 0.0–0.5)
LYMPH%: 13.1 % — ABNORMAL LOW (ref 14.0–49.7)
MCH: 34.6 pg — ABNORMAL HIGH (ref 25.1–34.0)
MCHC: 34.2 g/dL (ref 31.5–36.0)
MCV: 101.4 fL — ABNORMAL HIGH (ref 79.5–101.0)
MONO%: 8 % (ref 0.0–14.0)
Platelets: 128 10*3/uL — ABNORMAL LOW (ref 145–400)
RBC: 3.56 10*6/uL — ABNORMAL LOW (ref 3.70–5.45)
RDW: 16.1 % — ABNORMAL HIGH (ref 11.2–14.5)

## 2011-10-19 LAB — COMPREHENSIVE METABOLIC PANEL
AST: 38 U/L — ABNORMAL HIGH (ref 0–37)
Alkaline Phosphatase: 590 U/L — ABNORMAL HIGH (ref 39–117)
Glucose, Bld: 357 mg/dL — ABNORMAL HIGH (ref 70–99)
Potassium: 4.4 mEq/L (ref 3.5–5.3)
Sodium: 134 mEq/L — ABNORMAL LOW (ref 135–145)
Total Bilirubin: 1.1 mg/dL (ref 0.3–1.2)
Total Protein: 6.4 g/dL (ref 6.0–8.3)

## 2011-10-19 NOTE — Progress Notes (Signed)
OFFICE PROGRESS NOTE    Wonda Cheng, MD [address]   [place] [address]  DIAGNOSIS: 78 year old female with metastatic carcinoma with liver metastasis   CURRENT THERAPY: Patient is currently on hospice  INTERVAL HISTORY: [name] 78 y.o. female returns for followup visit. Overall patient seems to be holding her own. She had a CT of the chest abdomen and pelvis performed most recently. There is seems to be a little bit of expansion of the area of previous radiofrequency ablation in the liver. She also has a rising CA 19-9. There was also noted to be a new lesion. Clinically patient seems to be doing well and is without any significant complaints other than weakness and fatigue. Patient was seen by Dr. Fredia Sorrow who at that this time is not recommending another on her face. Otherwise review of systems is as below  MEDICAL HISTORY: Past Medical History  Diagnosis Date  . Diabetes mellitus   . Pancreatic cancer     pancreatic ca dx 4/11    ALLERGIES:  is allergic to augmentin; heparin; hydrocodone; levaquin; and vicodin.  MEDICATIONS:  I reviewed and they are recorded separately in the electronic medical record.  SURGICAL HISTORY: No past surgical history on file.  REVIEW OF SYSTEMS:  Constitutional: positive for chills, fatigue and weight loss Eyes: negative Ears, nose, mouth, throat, and face: negative Respiratory: negative Cardiovascular: negative Gastrointestinal: positive for Abdominal distention with shifting dullness and fluid thrill Genitourinary:negative Neurological: negative   PHYSICAL EXAMINATION: General appearance: alert, cooperative and appears stated age Head: Normocephalic, without obvious abnormality, atraumatic Neck: no adenopathy, no carotid bruit, no JVD, supple, symmetrical, trachea midline and thyroid not enlarged, symmetric, no tenderness/mass/nodules Lymph nodes: Cervical, supraclavicular, and axillary nodes  normal. Resp: clear to auscultation bilaterally and normal percussion bilaterally Back: symmetric, no curvature. ROM normal. No CVA tenderness. Cardio: regular rate and rhythm, S1, S2 normal, no murmur, click, rub or gallop GI: abnormal findings:  ascites, distended, obese and shifting dullness Extremities: edema Of the right extremity well is the left lower extremity but the right is greater than the left. There is no tenderness. Neurologic: Alert and oriented X 3, normal strength and tone. Normal symmetric reflexes. Normal coordination and gait  ECOG PERFORMANCE STATUS: 1 - Symptomatic but completely ambulatory  Blood pressure 138/73, pulse 65, temperature 98.1 F (36.7 C), temperature source Oral, height 5\' 2"  (1.575 m), weight 90 lb 11.2 oz (41.141 kg).  LABORATORY DATA: Lab Results  Component Value Date   WBC 4.7 10/19/2011   HGB 12.3 10/19/2011   HCT 36.0 10/19/2011   MCV 101.4* 10/19/2011   PLT 128* 10/19/2011      Chemistry      Component Value Date/Time   NA 134* 10/19/2011 1348   NA 141 08/23/2010 1404   K 4.4 10/19/2011 1348   K 4.5 08/23/2010 1404   CL 99 10/19/2011 1348   CL 100 08/23/2010 1404   CO2 25 10/19/2011 1348   CO2 30 08/23/2010 1404   BUN 8 10/19/2011 1348   BUN 11 08/23/2010 1404   CREATININE 0.73 10/19/2011 1348   CREATININE 0.6 08/23/2010 1404      Component Value Date/Time   CALCIUM 8.6 10/19/2011 1348   CALCIUM 9.6 08/23/2010 1404   ALKPHOS 590* 10/19/2011 1348   ALKPHOS 375* 08/23/2010 1404   AST 38* 10/19/2011 1348   AST 66* 08/23/2010 1404   ALT 29 10/19/2011 1348   BILITOT 1.1 10/19/2011 1348   BILITOT 0.70 08/23/2010  1404       RADIOGRAPHIC STUDIES:  No results found.  ASSESSMENT: 78 year old female with metastatic pancreatic carcinoma. Patient remains hospice appropriate. There is no need for any further intervention. We discussed this CT scan results at great length. All questions were answered today.   PLAN: continue hospice palliative care. Patient to be seen  back in 3 months time. No more scans or CA 19-9 to be drawn.  All questions were answered. The patient knows to call the clinic with any problems, questions or concerns. We can certainly see the patient much sooner if necessary.  I spent 20 minutes counseling the patient face to face. The total time spent in the appointment was 30 minutes.    Drue Second, MD Medical/Oncology [facility] [phone] (beeper) [phone] (Office)  10/22/2011, 1:07 PM

## 2011-10-23 ENCOUNTER — Telehealth: Payer: Self-pay | Admitting: *Deleted

## 2011-10-23 NOTE — Telephone Encounter (Signed)
PT. IS HAVING SIGNIFICANT BACK PAIN. THE LOWEST DOSE OF LIQUID MORPHINE MAKES HER VERY SLEEPY. PT. HAS BEEN USING TYLENOL FOR HER BACK PAIN. SHE IS TAKING TWO GRAMS OF TYLENOL IN A 24 HOUR PERIOD. HOSPICE NURSE, MARIE, WOULD LIKE TO TRY LOW DOSE DECADRON[2MG  TO 4MG ] ONCE A DAY. MARIE STATES THE PT. IS ON MEDICATION TO PROTECT GI TRACT AND SHE FEELS THE DECADRON WILL NOT AFFECT PT.'S DIABETES. THIS NOTE WAS GIVEN TO DR.KHAN'S NURSE, Garald Braver.

## 2011-10-24 ENCOUNTER — Telehealth: Payer: Self-pay | Admitting: *Deleted

## 2011-11-05 ENCOUNTER — Encounter: Payer: Self-pay | Admitting: *Deleted

## 2011-11-05 ENCOUNTER — Telehealth: Payer: Self-pay | Admitting: *Deleted

## 2011-11-05 NOTE — Progress Notes (Unsigned)
Triage took call from Hilda Lias, RN at hospice called with medication request- Morphine 20mg /ml  Disp 30ml. Take 0.25-29ml OPO Q1h for pain or short of breath, reviewed with MD Rx. Faxed to [facility]

## 2011-11-05 NOTE — Telephone Encounter (Signed)
Received call from Lillia Abed ,  RN @ [facility]  Requesting refill of :   Morphine  Sulfate  20 mg/ml ;  Amount  30  ML -   Take  0.25  -  1  ML  Po every 1 hr  PRN  For  Pain  Or   SOB. Hilda Lias stated rx can be faxed to   [facility]  In [place]   -    Fax       [phone]. Marie's    Phone     [phone].

## 2011-11-06 ENCOUNTER — Emergency Department (HOSPITAL_COMMUNITY)
Admission: EM | Admit: 2011-11-06 | Discharge: 2011-11-06 | Disposition: A | Discharge status: Home / Self Care | Payer: Medicare Other | Attending: Emergency Medicine | Admitting: Emergency Medicine

## 2011-11-06 ENCOUNTER — Encounter: Payer: Self-pay | Admitting: *Deleted

## 2011-11-06 ENCOUNTER — Encounter (HOSPITAL_COMMUNITY): Payer: Self-pay | Admitting: *Deleted

## 2011-11-06 ENCOUNTER — Emergency Department (HOSPITAL_COMMUNITY): Payer: Medicare Other

## 2011-11-06 DIAGNOSIS — S32000A Wedge compression fracture of unspecified lumbar vertebra, initial encounter for closed fracture: Secondary | ICD-10-CM

## 2011-11-06 DIAGNOSIS — R197 Diarrhea, unspecified: Secondary | ICD-10-CM | POA: Insufficient documentation

## 2011-11-06 DIAGNOSIS — E119 Type 2 diabetes mellitus without complications: Secondary | ICD-10-CM | POA: Insufficient documentation

## 2011-11-06 DIAGNOSIS — C787 Secondary malignant neoplasm of liver and intrahepatic bile duct: Secondary | ICD-10-CM

## 2011-11-06 DIAGNOSIS — Z7982 Long term (current) use of aspirin: Secondary | ICD-10-CM | POA: Insufficient documentation

## 2011-11-06 DIAGNOSIS — R1110 Vomiting, unspecified: Secondary | ICD-10-CM | POA: Insufficient documentation

## 2011-11-06 DIAGNOSIS — C259 Malignant neoplasm of pancreas, unspecified: Secondary | ICD-10-CM | POA: Insufficient documentation

## 2011-11-06 DIAGNOSIS — Z79899 Other long term (current) drug therapy: Secondary | ICD-10-CM | POA: Insufficient documentation

## 2011-11-06 DIAGNOSIS — M8448XA Pathological fracture, other site, initial encounter for fracture: Secondary | ICD-10-CM | POA: Insufficient documentation

## 2011-11-06 LAB — DIFFERENTIAL
Basophils Relative: 0 % (ref 0–1)
Eosinophils Absolute: 0.1 10*3/uL (ref 0.0–0.7)
Eosinophils Relative: 1 % (ref 0–5)
Lymphs Abs: 0.8 10*3/uL (ref 0.7–4.0)
Monocytes Absolute: 0.7 10*3/uL (ref 0.1–1.0)

## 2011-11-06 LAB — URINALYSIS, ROUTINE W REFLEX MICROSCOPIC
Leukocytes, UA: NEGATIVE
Nitrite: NEGATIVE
Specific Gravity, Urine: 1.01 (ref 1.005–1.030)
Urobilinogen, UA: 0.2 mg/dL (ref 0.0–1.0)

## 2011-11-06 LAB — COMPREHENSIVE METABOLIC PANEL
CO2: 26 mEq/L (ref 19–32)
Calcium: 8.3 mg/dL — ABNORMAL LOW (ref 8.4–10.5)
Creatinine, Ser: 0.36 mg/dL — ABNORMAL LOW (ref 0.50–1.10)
GFR calc Af Amer: >90 mL/min (ref >90)
GFR calc non Af Amer: >90 mL/min (ref >90)
Glucose, Bld: 229 mg/dL — ABNORMAL HIGH (ref 70–99)
Total Protein: 6.2 g/dL (ref 6.0–8.3)

## 2011-11-06 LAB — URINE MICROSCOPIC-ADD ON

## 2011-11-06 LAB — CBC
Hemoglobin: 11.4 g/dL — ABNORMAL LOW (ref 12.0–15.0)
MCH: 32.9 pg (ref 26.0–34.0)
MCHC: 34.3 g/dL (ref 30.0–36.0)
MCV: 96 fL (ref 78.0–100.0)
Platelets: 90 10*3/uL — ABNORMAL LOW (ref 150–400)
RBC: 3.46 MIL/uL — ABNORMAL LOW (ref 3.87–5.11)

## 2011-11-06 MED ORDER — MORPHINE SULFATE 2 MG/ML IJ SOLN
2.0000 mg | Freq: Once | INTRAMUSCULAR | Status: AC
  Administered 2011-11-06: 2 mg via INTRAVENOUS
  Filled 2011-11-06: qty 1

## 2011-11-06 MED ORDER — IOHEXOL 300 MG/ML  SOLN
100.0000 mL | Freq: Once | INTRAMUSCULAR | Status: AC | PRN
  Administered 2011-11-06: 65 mL via INTRAVENOUS

## 2011-11-06 MED ORDER — SODIUM CHLORIDE 0.9 % IV SOLN
INTRAVENOUS | Status: DC
  Administered 2011-11-06: 18:00:00 via INTRAVENOUS

## 2011-11-06 MED ORDER — METRONIDAZOLE 500 MG PO TABS: 500.0000 mg | ORAL_TABLET | Freq: Three times a day (TID) | ORAL | Status: AC

## 2011-11-06 MED ORDER — METRONIDAZOLE 500 MG PO TABS
500.0000 mg | ORAL_TABLET | Freq: Once | ORAL | Status: AC
Start: 2011-11-06 — End: 2011-11-06
  Administered 2011-11-06: 500 mg via ORAL
  Filled 2011-11-06: qty 1

## 2011-11-06 MED ORDER — SODIUM CHLORIDE 0.9 % IV BOLUS (SEPSIS)
700.0000 mL | Freq: Once | INTRAVENOUS | Status: AC
  Administered 2011-11-06: 700 mL via INTRAVENOUS

## 2011-11-06 MED ORDER — HEPARIN SOD (PORK) LOCK FLUSH 100 UNIT/ML IV SOLN
INTRAVENOUS | Status: AC
  Filled 2011-11-06: qty 5

## 2011-11-06 NOTE — ED Notes (Addendum)
Pt in with family c/o intermittent diarrhea over last 3 weeks, also vomiting x2 days and lower back pain, pt is a hospice pt due to pancreatic cancer, was on Avalox at end of December and diarrhea started them, improved after stopping medication, started again while taking decadron, pt sent in today to r/o c. Diff- pt also states she took and imodium yesterday and has not had any diarrhea since that time

## 2011-11-06 NOTE — ED Notes (Signed)
Patient stable upon discharge.  

## 2011-11-06 NOTE — Progress Notes (Signed)
Message from answering service, [facility] does not carry Rx fax over. Drema Dallas, RN with Hospice who recommended Rx be faxed to [facility] [phone] P# [phone]. Hilda Lias will call [facility] to advise Rx will be coming over.

## 2011-11-06 NOTE — ED Provider Notes (Signed)
History     CSN: [id number]  Arrival date & time 11/06/11  1526   First MD Initiated Contact with Patient 11/06/11 1545      Chief Complaint  Patient presents with  . Diarrhea  . Emesis    (Consider location/radiation/quality/duration/timing/severity/associated sxs/prior treatment) HPI  Patient has metastatic pancreatic cancer and is in hospice care. She relates December 25 she twisted her back showing her husband help to make the bed. She has been seen by Dr. Fredia Sorrow and it was felt she had a pulled muscle Dr. Welton Flakes also saw her and agreed. She was on antibiotics, Avelox when she had Reiter's and she finished her antibiotics about 2 weeks ago. Patient has liver metastases and abscesses and she responds best to Avelox per her daughter. Patient has had yellow stools for over a week and patient stopped her Creon. She did restart her Creon and continued to have yellow loose stools. Patient states she took Imodium 3 days ago and she's not had diarrhea for over 24 hours. She also has not had a BM today and she usually has BMs daily. She had vomiting once last night and today. She denies any blood. She also has pain in her rib cage her abdomen and her hips her back. She relates the hospice nurse increased her morphine today after seeing her.  Patient relates she's been off insulin for 3 months.  Past Medical History  Diagnosis Date  . Diabetes mellitus   . Pancreatic cancer     pancreatic ca dx 4/11    History reviewed. No pertinent past surgical history. Per daughter procedure, appendectomy, cholecystectomy, varicose vein, bunionectomy  History reviewed. No pertinent family history.  History  Substance Use Topics  . Smoking status: Never Smoker   . Smokeless tobacco: Not on file  . Alcohol Use: No   lives at home with spouse  OB History    Grav Para Term Preterm Abortions TAB SAB Ect Mult Living                  Review of Systems  All other systems reviewed and are  negative.    Allergies  Augmentin; Heparin; Hydrocodone; Levaquin; and Vicodin  Home Medications   Current Outpatient Rx  Name Route Sig Dispense Refill  . ASPIRIN EC 81 MG PO TBEC Oral Take 81 mg by mouth daily as needed. For "when she feels she needs it".    . DIPHENHYDRAMINE HCL 25 MG PO TABS Oral Take 25 mg by mouth at bedtime as needed. For sleep.    Marland Kitchen FLUTICASONE PROPIONATE 50 MCG/ACT NA SUSP Nasal Place 2 sprays into the nose daily.    . MORPHINE SULFATE (CONCENTRATE) 20 MG/ML PO SOLN Oral Take 2.5-5 mg by mouth every hour as needed. For pain.    Marland Kitchen PANCRELIPASE (LIP-PROT-AMYL) 3000-9500 UNITS PO CPEP Oral Take 1-2 capsules by mouth 4 (four) times daily. 2 with meals, 1 with snacks    . POTASSIUM CHLORIDE CRYS ER 20 MEQ PO TBCR Oral Take 20 mEq by mouth 3 (three) times daily.    Marland Kitchen PROBIOTIC PO Oral Take 1 capsule by mouth daily.    Marland Kitchen SILICONE-LANOLIN-ZINC OXIDE EX CREA Apply externally Apply 1 application topically as needed. For diaper rash.    . SPIRONOLACTONE 25 MG PO TABS Oral Take 25 mg by mouth daily.    . SUCRALFATE 1 G PO TABS Oral Take 1 g by mouth 4 (four) times daily.      BP 127/69  Pulse 80  Temp(Src) 98.7 F (37.1 C) (Oral)  Resp 16  Wt 87 lb (39.463 kg)  SpO2 100%  Vital signs normal    Physical Exam  Nursing note and vitals reviewed. Constitutional: She is oriented to person, place, and time.  Non-toxic appearance. She does not appear ill. No distress.       Underweight but alert and cooperative  HENT:  Head: Normocephalic and atraumatic.  Right Ear: External ear normal.  Left Ear: External ear normal.  Nose: Nose normal. No mucosal edema or rhinorrhea.  Mouth/Throat: Mucous membranes are normal. No dental abscesses or uvula swelling.       Tongue dry  Eyes: Conjunctivae and EOM are normal. Pupils are equal, round, and reactive to light.  Neck: Normal range of motion and full passive range of motion without pain. Neck supple.  Cardiovascular:  Normal rate, regular rhythm and normal heart sounds.  Exam reveals no gallop and no friction rub.   No murmur heard. Pulmonary/Chest: Effort normal and breath sounds normal. No respiratory distress. She has no wheezes. She has no rhonchi. She has no rales. She exhibits no tenderness and no crepitus.  Abdominal: Soft. Normal appearance and bowel sounds are normal. She exhibits no distension. There is tenderness. There is no rebound and no guarding.       Has mild diffuse tenderness without guarding or rebound  Musculoskeletal: Normal range of motion. She exhibits no edema and no tenderness.       Moves all extremities well.   Neurological: She is alert and oriented to person, place, and time. She has normal strength. No cranial nerve deficit.  Skin: Skin is warm, dry and intact. No rash noted. No erythema. No pallor.  Psychiatric: Her speech is normal and behavior is normal. Her mood appears not anxious.       Flat affect    ED Course  Procedures (including critical care time)  Given IV fluids. On recheck at 1720 patient states her pain is gone and she is feeling better. Daughter however is upset states the hospice nurse told him to come and get a scan to see what's causing her abdominal pain.  19:45 daughter upset when CT tech gave patient contrast, states they just had a CT scan. I have looked at her records and she had a PET scan ordered 1/4 that was cancelled. They state her next appt with Dr Welton Flakes is on April 2nd. Pt has agreed to do CT scan which is about all I can offer her tonight. They were advised by me and the CT tech that a PET scan is not something to be ordered to be done in the ED and a MRI is not indicated at this time.   2114 Dr Bonnielee Haff, states her L69fx is worse than prior scan, also has a couple of gas bubbles in the RUQ and cannot tell if it is in the bowel or outside the bowel wall.   2145 patient and daughter given information. Daughter very upset that the L3 fracture was not  noted in December. Patient has her daughter 3 times "this is enough I don't want anything else done".  As far as her lumbar fracture patient is not interested in having kyphoplasty done. Her daughter wants her to wear her brace patient's not sure she wants to because of the comfort. As far as the gas seen around the liver patient is interested in getting treatment at harm to hospice. She has a port and has access  to her in the ER. I am going to do hot to oncology to see what can be arranged.  Dr Marcelyn Ditty states to put on oral flagyl until the c diff results return, they can call Dr Vianne Bulls in am to get her fitted for a back brace, and then pain control which her hospice nurse increased her morphine today. States if needs to be admitted for pain control it can be done.   22:20 pt wants to go home. Daughter has talked to her hospice nurse and she is aware of the L3 fx and has told the daughter about noninvasive treatments for that. PT given IV morphine and oral flagyl prior to discharge in the ED>   Results for orders placed during the hospital encounter of 11/06/11  CBC      Component Value Range   WBC 6.3  4.0 - 10.5 (K/uL)   RBC 3.46 (*) 3.87 - 5.11 (MIL/uL)   Hemoglobin 11.4 (*) 12.0 - 15.0 (g/dL)   HCT 16.1 (*) 09.6 - 46.0 (%)   MCV 96.0  78.0 - 100.0 (fL)   MCH 32.9  26.0 - 34.0 (pg)   MCHC 34.3  30.0 - 36.0 (g/dL)   RDW 04.5  40.9 - 81.1 (%)   Platelets 90 (*) 150 - 400 (K/uL)  DIFFERENTIAL      Component Value Range   Neutrophils Relative 76  43 - 77 (%)   Lymphocytes Relative 12  12 - 46 (%)   Monocytes Relative 11  3 - 12 (%)   Eosinophils Relative 1  0 - 5 (%)   Basophils Relative 0  0 - 1 (%)   Neutro Abs 4.7  1.7 - 7.7 (K/uL)   Lymphs Abs 0.8  0.7 - 4.0 (K/uL)   Monocytes Absolute 0.7  0.1 - 1.0 (K/uL)   Eosinophils Absolute 0.1  0.0 - 0.7 (K/uL)   Basophils Absolute 0.0  0.0 - 0.1 (K/uL)   Smear Review PLATELET COUNT CONFIRMED BY SMEAR    COMPREHENSIVE METABOLIC PANEL        Component Value Range   Sodium 128 (*) 135 - 145 (mEq/L)   Potassium 3.8  3.5 - 5.1 (mEq/L)   Chloride 96  96 - 112 (mEq/L)   CO2 26  19 - 32 (mEq/L)   Glucose, Bld 229 (*) 70 - 99 (mg/dL)   BUN 11  6 - 23 (mg/dL)   Creatinine, Ser 9.14 (*) 0.50 - 1.10 (mg/dL)   Calcium 8.3 (*) 8.4 - 10.5 (mg/dL)   Total Protein 6.2  6.0 - 8.3 (g/dL)   Albumin 2.5 (*) 3.5 - 5.2 (g/dL)   AST 28  0 - 37 (U/L)   ALT 41 (*) 0 - 35 (U/L)   Alkaline Phosphatase 605 (*) 39 - 117 (U/L)   Total Bilirubin 1.4 (*) 0.3 - 1.2 (mg/dL)   GFR calc non Af Amer >90  >90 (mL/min)   GFR calc Af Amer >90  >90 (mL/min)  URINALYSIS, ROUTINE W REFLEX MICROSCOPIC      Component Value Range   Color, Urine YELLOW  YELLOW    APPearance CLEAR  CLEAR    Specific Gravity, Urine 1.010  1.005 - 1.030    pH 5.5  5.0 - 8.0    Glucose, UA NEGATIVE  NEGATIVE (mg/dL)   Hgb urine dipstick TRACE (*) NEGATIVE    Bilirubin Urine NEGATIVE  NEGATIVE    Ketones, ur NEGATIVE  NEGATIVE (mg/dL)   Protein, ur NEGATIVE  NEGATIVE (mg/dL)  Urobilinogen, UA 0.2  0.0 - 1.0 (mg/dL)   Nitrite NEGATIVE  NEGATIVE    Leukocytes, UA NEGATIVE  NEGATIVE   URINE MICROSCOPIC-ADD ON      Component Value Range   Squamous Epithelial / LPF RARE  RARE    RBC / HPF 0-2  <3 (RBC/hpf)   Laboratory interpretation all normal except for hyponatremia, hyperglycemia   Ct Abdomen Pelvis W Contrast  11/06/2011  *RADIOLOGY REPORT*  Clinical Data: Prostate cancer.  Abdominal pain.  Diarrhea. Emesis.  CT ABDOMEN AND PELVIS WITH CONTRAST  Technique:  Multidetector CT imaging of the abdomen and pelvis was performed following the standard protocol during bolus administration of intravenous contrast.  Contrast: 65mL OMNIPAQUE IOHEXOL 300 MG/ML IV SOLN  Comparison: 10/11/2011  Findings: Stable hypodensity in the right lobe of the liver corresponding to the ablation site on image 14.  Adjacent small seven mm hypodensity is unchanged.  Pneumobilia is stable.   Gastroesophageal varices are unchanged.  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  Fluid and inflammatory changes in the right upper quadrant anteriorly adjacent to the tip of the liver are again noted and are progressed.  Wall thickening of the adjacent loop of small bowel is slightly increased.  This involves the anterior abdominal wall musculature and adjacent jejunal loops.  There are two foci of gas in the right upper quadrant on image 21 that may or may not be contained within bowel loops.  If these foci of gas are extraluminal, they may be contained within a developing abscess.  Perforation of bowel is also a consideration.  Spleen kidneys, adrenal glands are stable in appearance.  Lymph nodes adjacent to the right ventricle are noted.  Free fluid is seen layering in the pelvis.  Unremarkable bladder.  There is been further loss of height at the L3 compression fracture.  Minimal retropulsion of the superior endplate.  IMPRESSION: Compared the prior study, foci of gas  are present adjacent to the inferior tip of the liver.  This finding may represent gas and a developing abscess or possibly extraluminal bowel gas from perforation of bowel.  It is conceivable that the anatomy has been distorted and these foci of gas are contained within bowel loops tethered to the liver.  Clinical correlation is recommended.  Free fluid in the pelvis.  Worsening L3 compression deformity.  Original Report Authenticated By: Donavan Burnet, M.D.   Diagnoses that have been ruled out:  None  Diagnoses that are still under consideration:  None  Final diagnoses:  Lumbar compression fracture  Diarrhea  Pancreatic cancer metastasized to liver    New Prescriptions   METRONIDAZOLE (FLAGYL) 500 MG TABLET    Take 1 tablet (500 mg total) by mouth 3 (three) times daily.   Plan discharge  Devoria Albe, MD, FACEP   MDM           Ward Givens, MD 11/06/11 2224

## 2011-11-06 NOTE — ED Notes (Signed)
Bed:WA14<BR> Expected date:<BR> Expected time:<BR> Means of arrival:<BR> Comments:<BR> Hold for triage 1

## 2011-11-07 LAB — CLOSTRIDIUM DIFFICILE BY PCR: Toxigenic C. Difficile by PCR: NEGATIVE

## 2011-11-09 ENCOUNTER — Telehealth: Payer: Self-pay | Admitting: *Deleted

## 2011-11-09 NOTE — Telephone Encounter (Signed)
[name] called reports pt to ED on 1/22 due to back pain/diarrhea, given Flagyl for possible C-Diff. Reuested to stop Flagyl if C-Diff Neg.   Labs reviewed with MD- pt tested Neg for C-diff- Per MD pt to stop Flagyl. Rose Lias, RN verbalized understanding.

## 2011-11-10 ENCOUNTER — Ambulatory Visit: Payer: Self-pay

## 2011-11-14 ENCOUNTER — Other Ambulatory Visit: Payer: Self-pay | Admitting: Oncology

## 2011-11-14 ENCOUNTER — Ambulatory Visit: Payer: Self-pay | Admitting: Unknown Physician Specialty

## 2011-11-14 DIAGNOSIS — I10 Essential (primary) hypertension: Secondary | ICD-10-CM

## 2011-11-14 LAB — BASIC METABOLIC PANEL
Anion Gap: 7 (ref 7–16)
Creatinine: 0.49 mg/dL — ABNORMAL LOW (ref 0.60–1.30)
EGFR (African American): >60
EGFR (Non-African Amer.): >60
Glucose: 135 mg/dL — ABNORMAL HIGH (ref 65–99)
Sodium: 137 mmol/L (ref 136–145)

## 2011-11-14 LAB — URINALYSIS, COMPLETE
Bilirubin,UR: NEGATIVE
Blood: NEGATIVE
Glucose,UR: NEGATIVE mg/dL (ref 0–75)
Leukocyte Esterase: NEGATIVE
Ph: 6 (ref 4.5–8.0)
Squamous Epithelial: 6

## 2011-11-14 LAB — CBC
HGB: 11.8 g/dL — ABNORMAL LOW (ref 12.0–16.0)
MCHC: 33.3 g/dL (ref 32.0–36.0)
WBC: 5.2 10*3/uL (ref 3.6–11.0)

## 2011-11-20 ENCOUNTER — Ambulatory Visit: Payer: Self-pay | Admitting: Unknown Physician Specialty

## 2011-11-22 LAB — PATHOLOGY REPORT

## 2011-12-05 ENCOUNTER — Other Ambulatory Visit: Payer: Self-pay | Admitting: *Deleted

## 2011-12-18 ENCOUNTER — Other Ambulatory Visit: Payer: Self-pay | Admitting: *Deleted

## 2011-12-18 ENCOUNTER — Telehealth: Payer: Self-pay | Admitting: *Deleted

## 2011-12-18 DIAGNOSIS — R933 Abnormal findings on diagnostic imaging of other parts of digestive tract: Secondary | ICD-10-CM

## 2011-12-18 NOTE — Telephone Encounter (Signed)
2 calls received- 1st call from Hospice RN - [name] giving assessment update per visit today.  Primary concerns noted are - Increased abd distention with tightness Noted general SOB but pt has periodic episodes of severe SOB Diminished Right lung sounds. Blood pressure noted today of 80/50.  [name] is inquiring about pt having a paracentesis for comfort and due to lung sounds and episodic SOB if MD wants any scans.  Return call for [name] is [phone]  2nd call is from pt's dtr- [name] just stating call her with follow up per call she knows [name] left. [name] also inquired if pt needs to be seen sooner than scheduled visit in April.  Return call for [name] is [phone].  This note will be routed to MD for review.

## 2011-12-19 ENCOUNTER — Other Ambulatory Visit: Payer: Self-pay | Admitting: *Deleted

## 2011-12-19 ENCOUNTER — Telehealth: Payer: Self-pay | Admitting: Oncology

## 2011-12-19 ENCOUNTER — Telehealth: Payer: Self-pay | Admitting: *Deleted

## 2011-12-19 ENCOUNTER — Ambulatory Visit (HOSPITAL_COMMUNITY)
Admission: RE | Admit: 2011-12-19 | Discharge: 2011-12-19 | Disposition: A | Discharge status: Home / Self Care | Payer: Medicare Other | Source: Ambulatory Visit | Attending: Oncology | Admitting: Oncology

## 2011-12-19 DIAGNOSIS — C250 Malignant neoplasm of head of pancreas: Secondary | ICD-10-CM

## 2011-12-19 DIAGNOSIS — R933 Abnormal findings on diagnostic imaging of other parts of digestive tract: Secondary | ICD-10-CM

## 2011-12-19 DIAGNOSIS — C259 Malignant neoplasm of pancreas, unspecified: Secondary | ICD-10-CM | POA: Insufficient documentation

## 2011-12-19 DIAGNOSIS — C253 Malignant neoplasm of pancreatic duct: Secondary | ICD-10-CM

## 2011-12-19 DIAGNOSIS — C801 Malignant (primary) neoplasm, unspecified: Secondary | ICD-10-CM | POA: Insufficient documentation

## 2011-12-19 DIAGNOSIS — R188 Other ascites: Secondary | ICD-10-CM | POA: Insufficient documentation

## 2011-12-19 NOTE — Telephone Encounter (Signed)
Pt's daughter Junious Dresser called [facility] regarding Paracentesis. Returned [employer]. Pt has an appt today at 1pm. Junious Dresser advised she had just got call from scheduling regarding appt.

## 2011-12-19 NOTE — Telephone Encounter (Signed)
Patient's daughter called requesting appt. for paracentesis.  Scheduler notified of appointment request and will call patient's daughter with schedule.  New order entered due to original order expired on 12-18-11.

## 2011-12-19 NOTE — Telephone Encounter (Signed)
S/w the pt's dtr and she is aware of the paracentesis appt today@1 :00pm

## 2011-12-19 NOTE — Procedures (Signed)
US guided therapeutic paracentesis performed yielding 1.5 liters yellow fluid. No immediate complications.

## 2011-12-24 ENCOUNTER — Telehealth: Payer: Self-pay | Admitting: *Deleted

## 2011-12-24 ENCOUNTER — Encounter: Payer: Self-pay | Admitting: *Deleted

## 2011-12-24 NOTE — Telephone Encounter (Signed)
Call from [place], pt's daughter [name] called regarding pt's abdominal distension.  MD made aware, notified pt's daughter [name] to bring pt in at 11:30am on 12/26/11 to be evaluated. [name] advised " mom looks  9 months pregnant, they pulled about a Liter of fluid off her on  12/19/11 and it did not completely relieve her distension. She is not getting a lot of air to her Right Lung and Hospice RN said she may need CT Scan or Chest Xray. I just want to know if this is a tumor and if it's grown. Her ankles are swollen, she is going to the bathroom every 1 1/2 at night so she is not getting a lot of sleep either. It would be great if Dr. Welton Flakes wants a CT Scan then we do it all the same day."  Discussed with [name] her concerns and Hospice Rn's recommendations.  Any changes to pt's scheduled appts she will be notified. Currently pt is to bee seen on 12/26/11 by NR at 1130am [name] verbalized understanding and confirmed appt date/time

## 2011-12-24 NOTE — Telephone Encounter (Signed)
called daughter called at [phone] patient's daughter confirmed over that dr.khan's nurse called and told her to bring her mother in on 12-26-2011 starting at 11:00 lab

## 2011-12-26 ENCOUNTER — Ambulatory Visit (HOSPITAL_BASED_OUTPATIENT_CLINIC_OR_DEPARTMENT_OTHER): Payer: Medicare Other | Admitting: Family

## 2011-12-26 ENCOUNTER — Other Ambulatory Visit (HOSPITAL_BASED_OUTPATIENT_CLINIC_OR_DEPARTMENT_OTHER): Payer: Medicare Other | Admitting: Lab

## 2011-12-26 VITALS — BP 120/70 | HR 71 | Temp 97.2°F | Ht 62.0 in | Wt 98.2 lb

## 2011-12-26 DIAGNOSIS — R188 Other ascites: Secondary | ICD-10-CM

## 2011-12-26 DIAGNOSIS — C259 Malignant neoplasm of pancreas, unspecified: Secondary | ICD-10-CM

## 2011-12-26 DIAGNOSIS — C787 Secondary malignant neoplasm of liver and intrahepatic bile duct: Secondary | ICD-10-CM

## 2011-12-26 DIAGNOSIS — R609 Edema, unspecified: Secondary | ICD-10-CM

## 2011-12-26 LAB — COMPREHENSIVE METABOLIC PANEL
AST: 65 U/L — ABNORMAL HIGH (ref 0–37)
Albumin: 2.4 g/dL — ABNORMAL LOW (ref 3.5–5.2)
Alkaline Phosphatase: 740 U/L — ABNORMAL HIGH (ref 39–117)
BUN: 10 mg/dL (ref 6–23)
Creatinine, Ser: 0.53 mg/dL (ref 0.50–1.10)
Potassium: 4.5 mEq/L (ref 3.5–5.3)

## 2011-12-26 LAB — CBC WITH DIFFERENTIAL/PLATELET
Basophils Absolute: 0 10*3/uL (ref 0.0–0.1)
EOS%: 0.2 % (ref 0.0–7.0)
HGB: 11 g/dL — ABNORMAL LOW (ref 11.6–15.9)
MCH: 35 pg — ABNORMAL HIGH (ref 25.1–34.0)
MCHC: 34.4 g/dL (ref 31.5–36.0)
MCV: 101.8 fL — ABNORMAL HIGH (ref 79.5–101.0)
MONO%: 6.3 % (ref 0.0–14.0)
RDW: 17.7 % — ABNORMAL HIGH (ref 11.2–14.5)

## 2011-12-26 MED ORDER — FUROSEMIDE 20 MG PO TABS: 20.0000 mg | ORAL_TABLET | Freq: Every day | ORAL | Status: AC

## 2011-12-27 ENCOUNTER — Encounter: Payer: Self-pay | Admitting: Family

## 2011-12-27 DIAGNOSIS — C259 Malignant neoplasm of pancreas, unspecified: Secondary | ICD-10-CM | POA: Insufficient documentation

## 2011-12-27 NOTE — Progress Notes (Signed)
OFFICE PROGRESS NOTE    MOFFETT, Francis Dowse, MD, MD [address]   [place] [address]  DIAGNOSIS: metastatic pancreatic cancer with liver metastasis   CURRENT THERAPY: Hospice  INTERVAL HISTORY: Pts daughter called the office asking for appointment to assess decline in physical condition. Based on physical exam by [facility] nurse, and at her recommendation, daughter requests CT abdomen to assess increasing girth and CT chest or chest x-ray to assess decreased breath sounds. There has been an overall decline in the pt's condition over the last several weeks with increased fatigue and weakness. Appetite is poor.   US guided paracentesis 3/6 yielded 1.5 liters ascites. Daughter is concerned the increasing abdominal girth is tumor, I explain a more plausible explanation is recurrence of ascites. We discuss carcinomatosis, its prevalence in pancreatic cancer, and the importance of only doing a study if the outcome will change what we do. I explained that if this were tumor, a surgical intervention would be impossible at this point, given her overall debilitated status, and in the presence of liver mets. I suggested to the patient and daughter that a kinder, more gentler approach would be symptom management, ordering repeated paracenteses when discomfort dictated.   Patients spends more and more time in bed. Is grateful for the two years she has had relatively good quality of life following whipple procedure, and realizes this is most likely progression of disease.   MEDICAL HISTORY: Past Medical History  Diagnosis Date  . Diabetes mellitus   . Pancreatic cancer     pancreatic ca dx 4/11    ALLERGIES:  is allergic to augmentin; heparin; hydrocodone; levaquin; and vicodin.  MEDICATIONS:  I reviewed and they are recorded separately in the electronic medical record.  SURGICAL HISTORY: Whipple.   PHYSICAL EXAMINATION:  General: Ill-appearing, cachectic female in no  obvious distress.  EENT: No ocular or oral lesions. No stomatitis.  Respiratory: Left lung is clear to auscultation, diminished breath sounds, right base. No accessory muscle use. Cardiac: No murmur, rub or tachycardia. No upper or lower extremity edema.  GI: Abdomen is soft, no palpable hepatosplenomegaly. No fluid wave. No tenderness. Musculoskeletal: No kyphosis, no tenderness over the spine, ribs or hips. Lymph: No cervical, infraclavicular, axillary or inguinal adenopathy. Neuro: No focal neurological deficits. Psych: Alert and oriented X 3, appropriate mood and affect.   Blood pressure 120/70, pulse 71, temperature 97.2 F (36.2 C), temperature source Oral, height 5\' 2"  (1.575 m), weight 98 lb 3.2 oz (44.543 kg). O2 sat 100%.   LABORATORY DATA: Lab Results  Component Value Date   WBC 7.2 12/26/2011   HGB 11.0* 12/26/2011   HCT 31.9* 12/26/2011   MCV 101.8* 12/26/2011   PLT 161 12/26/2011  CA 19-9: 10927.0    Chemistry      Component Value Date/Time   NA 129* 12/26/2011 1101   NA 141 08/23/2010 1404   K 4.5 12/26/2011 1101   K 4.5 08/23/2010 1404   CL 96 12/26/2011 1101   CL 100 08/23/2010 1404   CO2 24 12/26/2011 1101   CO2 30 08/23/2010 1404   BUN 10 12/26/2011 1101   BUN 11 08/23/2010 1404   CREATININE 0.53 12/26/2011 1101   CREATININE 0.6 08/23/2010 1404      Component Value Date/Time   CALCIUM 8.1* 12/26/2011 1101   CALCIUM 9.6 08/23/2010 1404   ALKPHOS 740* 12/26/2011 1101   ALKPHOS 375* 08/23/2010 1404   AST 65* 12/26/2011 1101   AST 66* 08/23/2010 1404  ALT 51* 12/26/2011 1101   BILITOT 4.8* 12/26/2011 1101   BILITOT 0.70 08/23/2010 1404     ASSESSMENT:  1. Metastatic pancreatic carcinoma with progression of disease. Patient remains hospice appropriate.  2. Worsening lower extremity edema.   PLAN:  1. Continue hospice care.  2. Keep regularly scheduled appt for April with Dr. Welton Flakes. No more scans or CA 19-9 to be drawn. 3. Repeated paracentesis as needed for recurrent  ascites.  4. Low dose Lasix, per daughter's request.   I spent 60 minutes with the pt and daughter, 50 minutes were spent discussing end of life issues and recapping her current clinical picture and outlining a plan of care.   All questions were answered. The patient knows to call the clinic with any problems, questions or concerns. We can certainly see the patient much sooner if necessary.  I spent 20 minutes counseling the patient face to face. The total time spent in the appointment was 30 minutes.    Drue Second, MD Medical/Oncology [facility] [phone] (beeper) [phone] (Office)  12/27/2011, 2:23 PM

## 2012-01-02 NOTE — Telephone Encounter (Signed)
error 

## 2012-01-05 ENCOUNTER — Other Ambulatory Visit: Payer: Self-pay | Admitting: Oncology

## 2012-01-10 ENCOUNTER — Telehealth: Payer: Self-pay | Admitting: *Deleted

## 2012-01-10 NOTE — Telephone Encounter (Signed)
Hospice RN Hilda Lias called [facility] with the following update for Dr. Welton Flakes:  "Catheter place 2 weeks ago for urinary frequency and hesitancy. Pt has had bleeding into foley bag and around catheter insertion site.  Pt requested it be taken out. When urinary problemin past pt has taken keflex, Rx was called in in case pt has s/s of infection.  If pt unable to void may need to reinsert foley. Pt is declining slowly. She may have a couple weeks left"  Message forwarded to MD for review

## 2012-01-16 ENCOUNTER — Other Ambulatory Visit: Payer: Medicare Other | Admitting: Lab

## 2012-01-16 ENCOUNTER — Ambulatory Visit: Payer: Medicare Other | Admitting: Family

## 2012-01-17 ENCOUNTER — Ambulatory Visit: Payer: Medicare Other | Admitting: Oncology

## 2012-01-17 ENCOUNTER — Other Ambulatory Visit: Payer: Medicare Other | Admitting: Lab

## 2012-01-21 ENCOUNTER — Ambulatory Visit: Payer: Medicare Other | Admitting: Family

## 2012-01-21 ENCOUNTER — Other Ambulatory Visit: Payer: Medicare Other | Admitting: Lab

## 2012-01-29 ENCOUNTER — Other Ambulatory Visit: Payer: Self-pay | Admitting: *Deleted

## 2012-01-29 MED ORDER — MORPHINE SULFATE (CONCENTRATE) 20 MG/ML PO SOLN: 2.5000 mg | ORAL | Status: DC | PRN

## 2012-01-29 NOTE — Telephone Encounter (Signed)
Pt currently has 8 cc left. Refill requested by [facility] Triage Nurse.

## 2012-01-31 ENCOUNTER — Telehealth: Payer: Self-pay | Admitting: *Deleted

## 2012-01-31 NOTE — Telephone Encounter (Signed)
[name] , RN at [facility] called with update on pt status. Pt is declining rapidly, bilateral +3 edema to feet, ankles legs up to her knees. Abdomen  Is larger and firm. No breath sounds to right lung.  Morphine for comfort. RN requested to increase pt's Lasix and aldactone to help ease pt breathing and pull  Off some of the fulid.  Reviewed with MD.  Returned Marie's call. [facility] VO from Dr. Welton Flakes increase Aldactone to 50mg  daily.and increase Lasix 40mg  daily Requested call back to confirm msg rcvd.

## 2012-02-12 ENCOUNTER — Other Ambulatory Visit: Payer: Self-pay | Admitting: Medical Oncology

## 2012-02-12 DIAGNOSIS — C259 Malignant neoplasm of pancreas, unspecified: Secondary | ICD-10-CM

## 2012-02-13 ENCOUNTER — Other Ambulatory Visit: Payer: Medicare Other | Admitting: Lab

## 2012-02-13 ENCOUNTER — Ambulatory Visit: Payer: Medicare Other | Admitting: Oncology

## 2012-02-13 NOTE — Progress Notes (Signed)
Rec'd message from switchboard that pt's daughter called to cancel pt's appt today, as she is on the decline.  MD aware and will call pt's daughter.

## 2012-02-13 NOTE — Progress Notes (Signed)
Faxed hospice certification and plan of treatment signed by MD to [facility] at [facility] [phone].

## 2012-02-21 ENCOUNTER — Other Ambulatory Visit: Payer: Self-pay | Admitting: Medical Oncology

## 2012-02-21 ENCOUNTER — Telehealth: Payer: Self-pay | Admitting: Medical Oncology

## 2012-02-21 MED ORDER — MORPHINE SULFATE (CONCENTRATE) 20 MG/ML PO SOLN: 2.5000 mg | ORAL | Status: DC | PRN

## 2012-02-21 MED ORDER — MORPHINE SULFATE (CONCENTRATE) 20 MG/ML PO SOLN: 2.5000 mg | ORAL | Status: AC | PRN

## 2012-02-21 NOTE — Telephone Encounter (Signed)
Ok noted  

## 2012-02-21 NOTE — Telephone Encounter (Signed)
Received a call from [facility] with [facility] stating that "[name] is now bed bound, semi-comatose most of the time.  She is not eating or drinking, only a few sips at a time, however she is comfortable.  She is very jaundiced right now.  Her family is coping very well.  [name] does need a refill on her liquid morphine."  Will review with MD.  Order read from Hilda Lias: Morphine immediate release solution 20 mg/mL Requesting a 30 mL bottle, take as directed

## 2012-03-15 DEATH — deceased

## 2012-04-02 IMAGING — CR DG CHEST 2V
1 series · 2 of 2 positions shown · non-contrast
Comparison: none

REASON FOR EXAM: fever
COMMENTS:

[Series 1: view not recorded · 0.17mm/px · 2 of 2 slices shown]
[im 1/2]
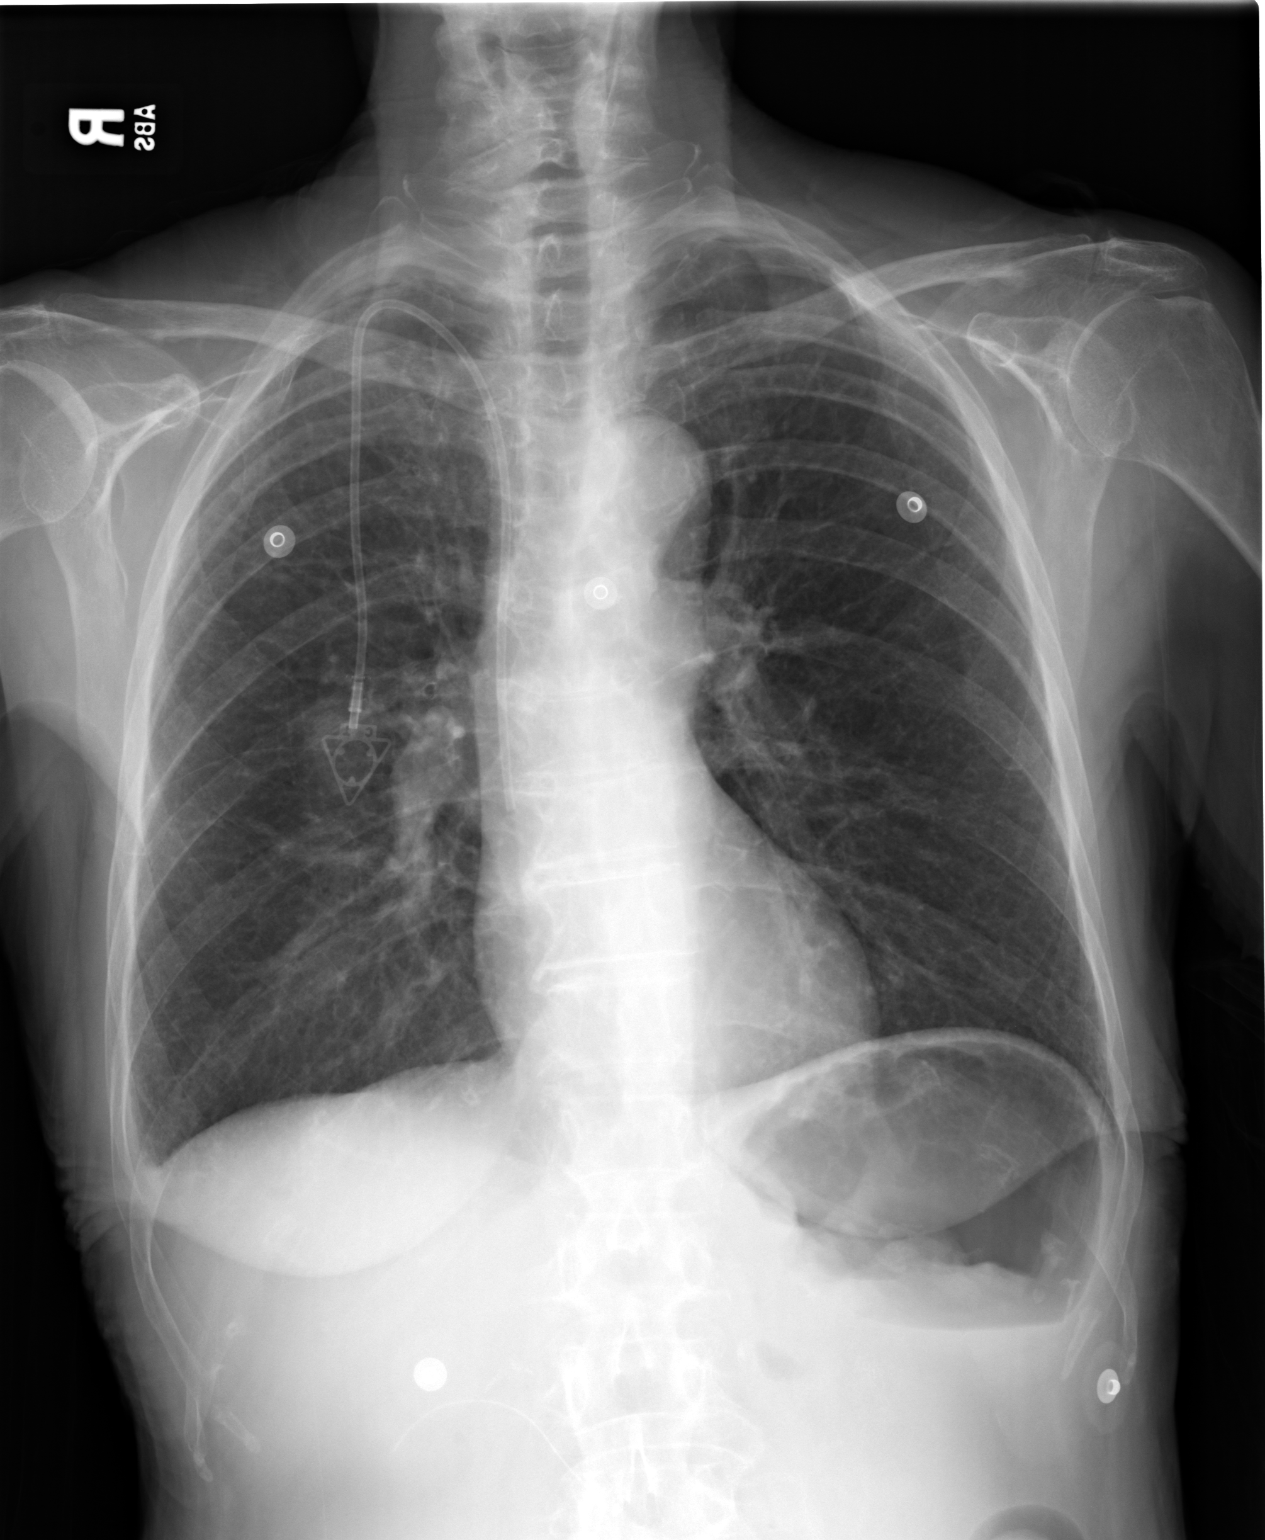
[im 2/2]
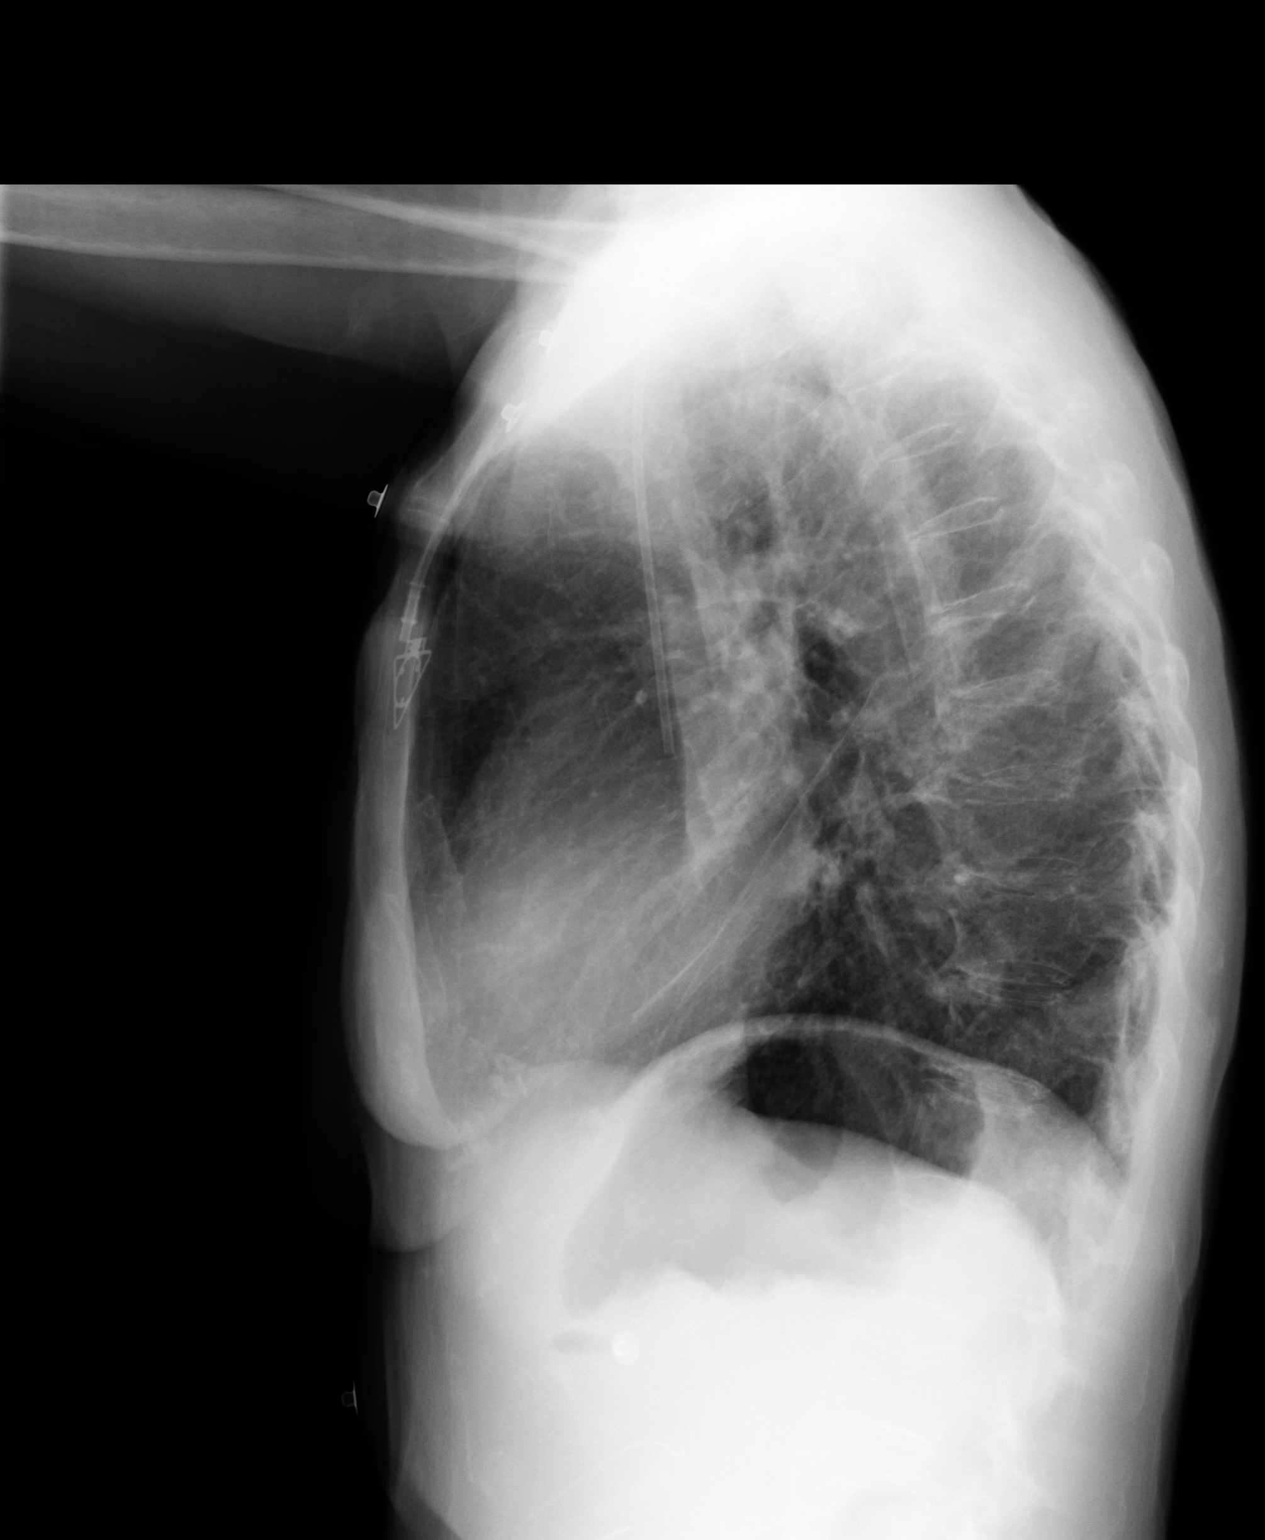

[2 of 2 positions shown; findings below may reference images not displayed]

PROCEDURE:     DXR - DXR CHEST PA (OR AP) AND LATERAL  - December 14, 2010  [DATE]

RESULT:     Comparison is made to study 19 January, 2010.

The lungs are mildly hyperinflated. There is no focal infiltrate. The
cardiac silhouette is normal in size. There is a Port-A-Cath-type appliance
in place with the tip of the catheter in the region of the distal SVC. The
pulmonary vascularity is not engorged. There is no pleural effusion. The
mediastinum is not widened.
IMPRESSION: I do not see evidence of pneumonia nor other acute
cardiopulmonary abnormality.

## 2012-04-05 IMAGING — CR DG CHEST 2V
1 series · 2 of 2 positions shown · non-contrast
Comparison: none

REASON FOR EXAM: f/u sepsis, r/o pneumonia
COMMENTS:

PROCEDURE:     DXR - DXR CHEST PA (OR AP) AND LATERAL  - December 17, 2010  [DATE]
RESULT:     Comparison: 12/14/2010, 01/19/2010

[Series 1: view not recorded · 0.17mm/px · 2 of 2 slices shown]
[im 1/2]
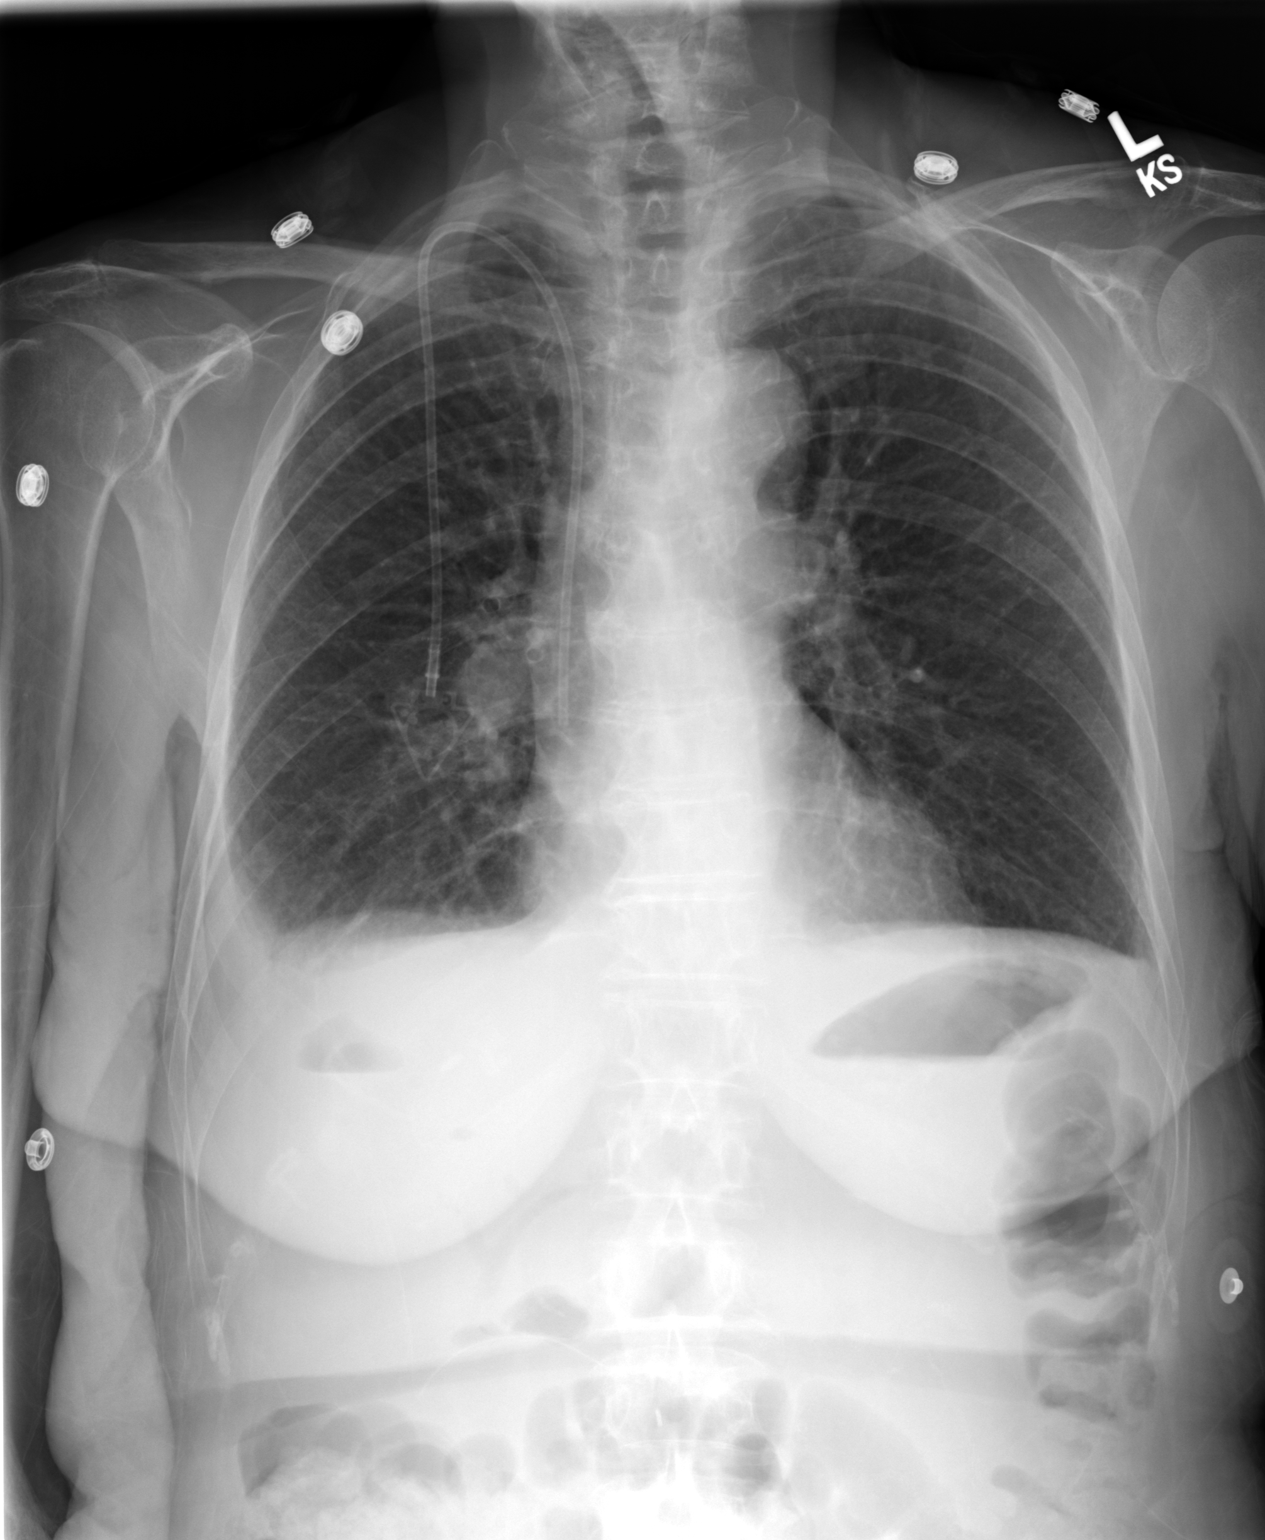
[im 2/2]
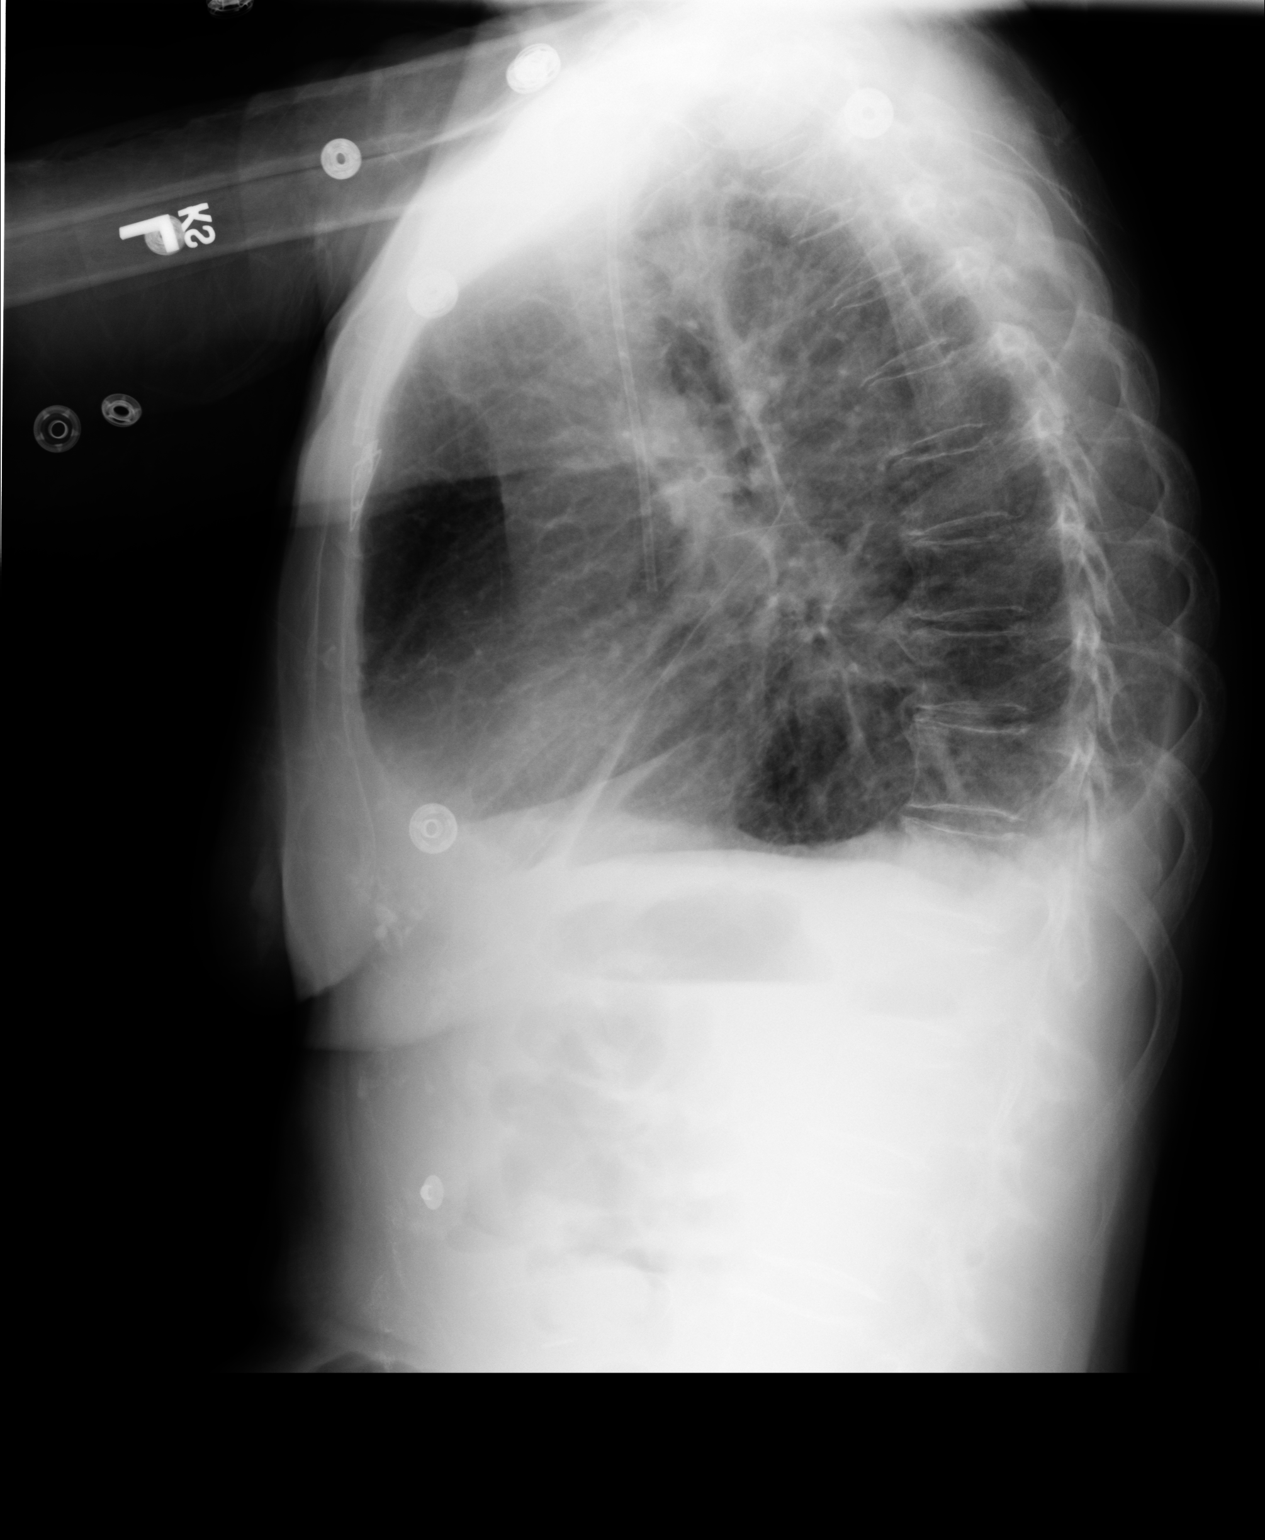

[2 of 2 positions shown; findings below may reference images not displayed]

FINDINGS: PA and lateral chest radiographs are provided. There are trace bilateral
pleural effusions. There is no focal parenchymal opacity. There is no
pneumothorax. There is mild interstitial thickening. The heart and
mediastinum are unremarkable.  The osseous structures are unremarkable.
IMPRESSION: Trace bilateral pleural effusions.

## 2012-04-06 IMAGING — CT CT HEAD W/O CM
1 series · 16 of 30 positions shown, 20 images · non-contrast
Comparison: None.

CLINICAL DATA: Hallucinations.  Weakness.

CT HEAD WITHOUT CONTRAST
TECHNIQUE: Contiguous axial images were obtained from the base of
the skull through the vertex without contrast.

[Series 2: head_seq 4.5 h37s st · axial · 0.43mm/px · z∈[+1239,+1365]mm · 16 of 32 slices shown, 20 images]
[im 2/32  brain]
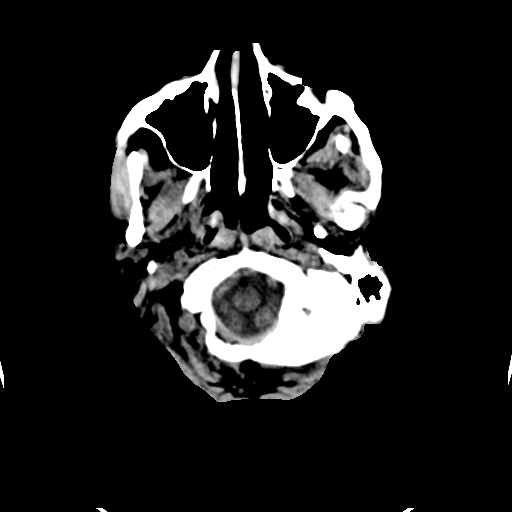
[im 2/32  bone]
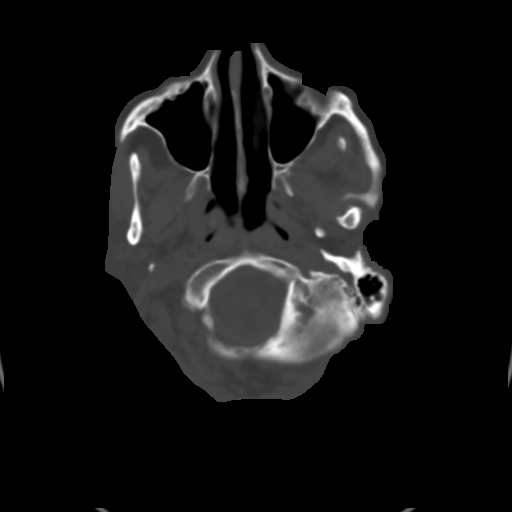
[im 4/32  brain]
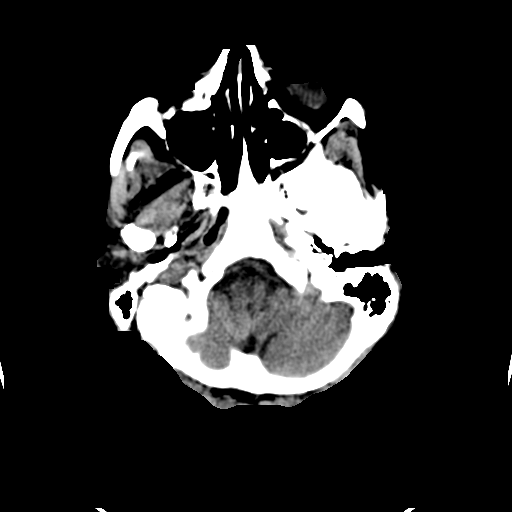
[im 6/32  brain]
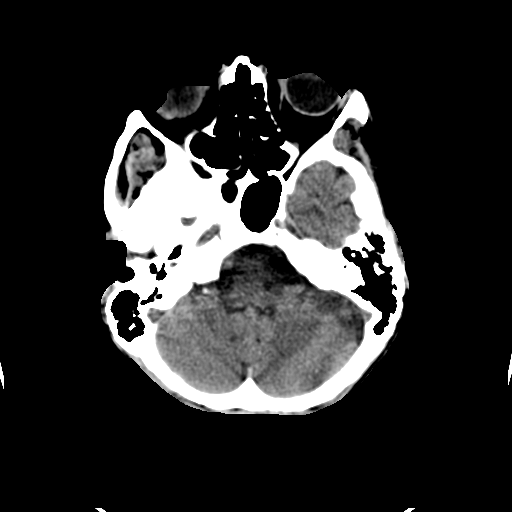
[im 8/32  brain]
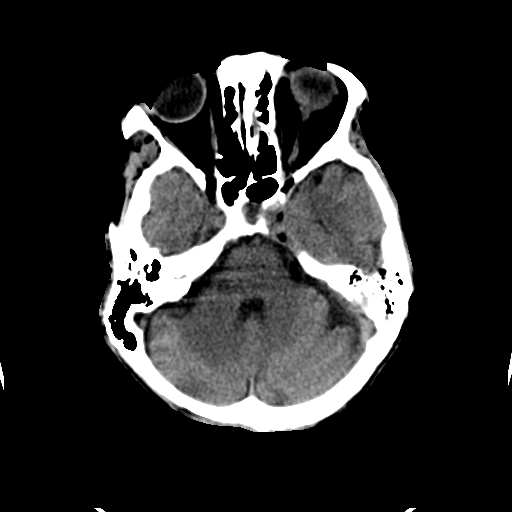
[im 9/32  brain]
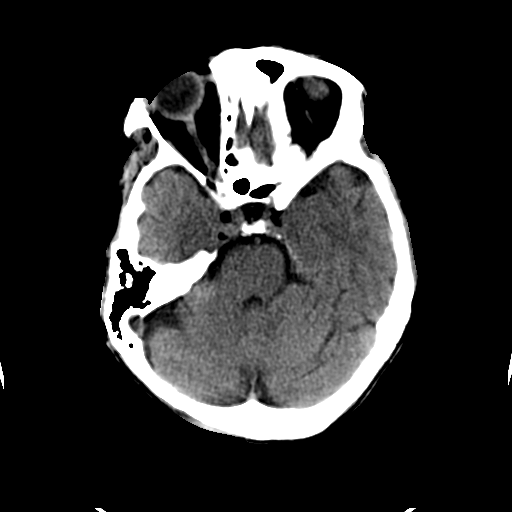
[im 9/32  bone]
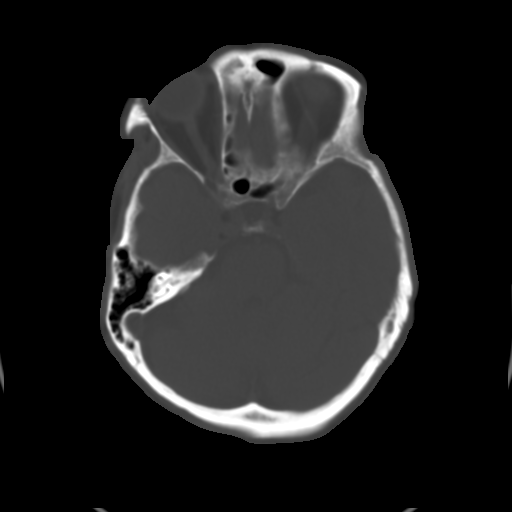
[im 11/32  brain]
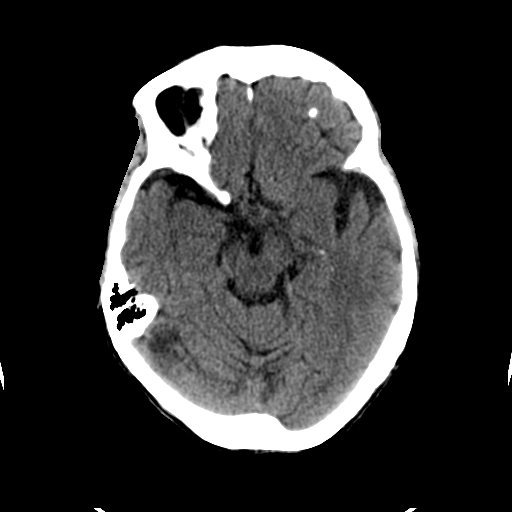
[im 13/32  brain]
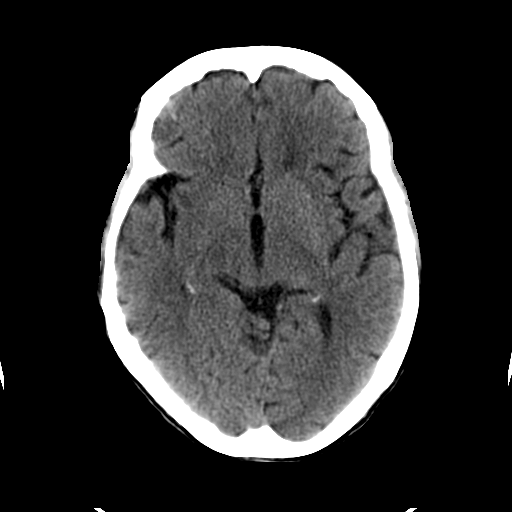
[im 15/32  brain]
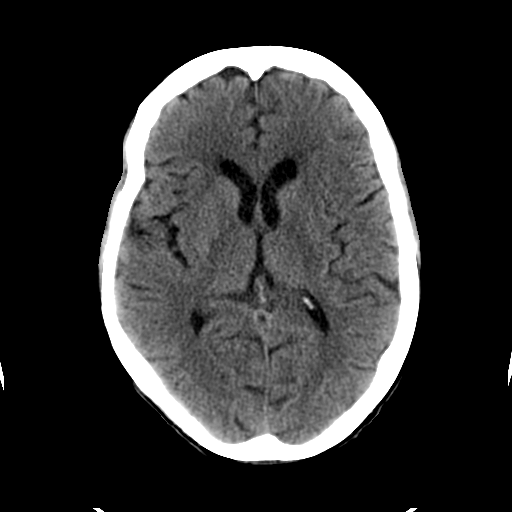
[im 17/32  brain]
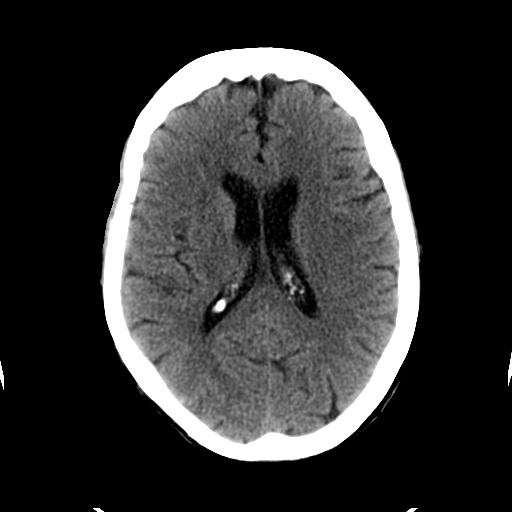
[im 17/32  bone]
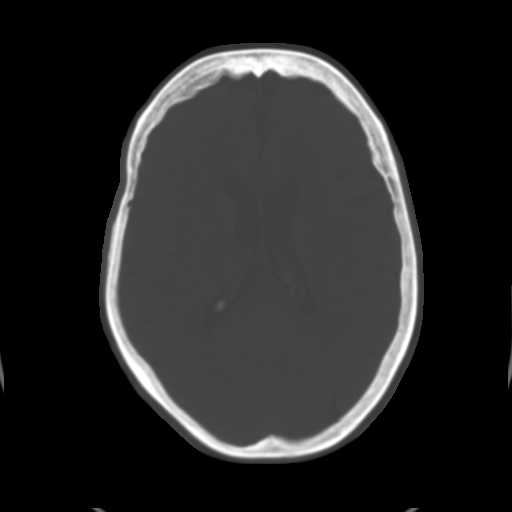
[im 19/32  brain]
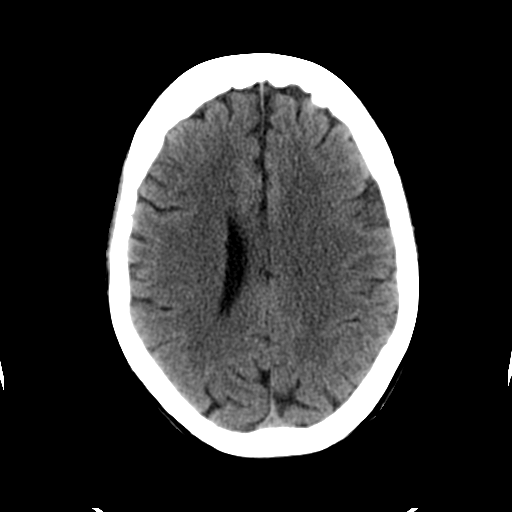
[im 21/32  brain]
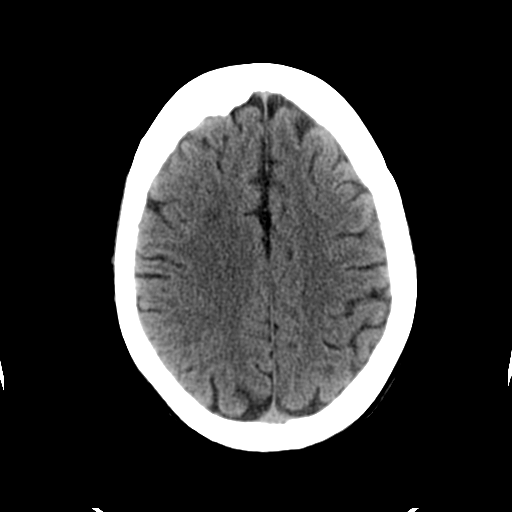
[im 23/32  brain]
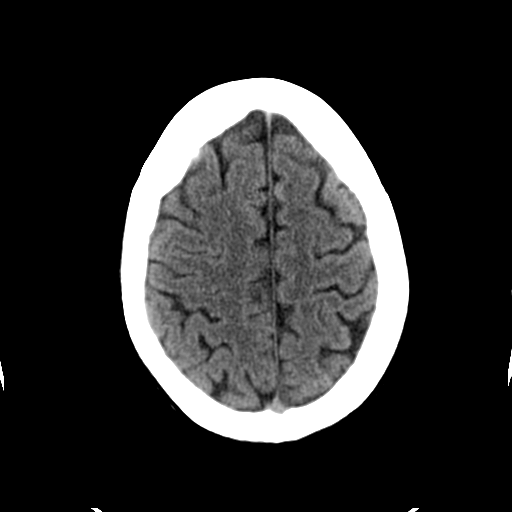
[im 24/32  brain]
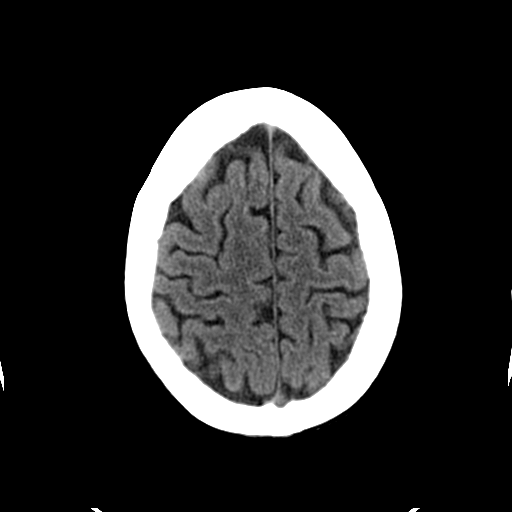
[im 24/32  bone]
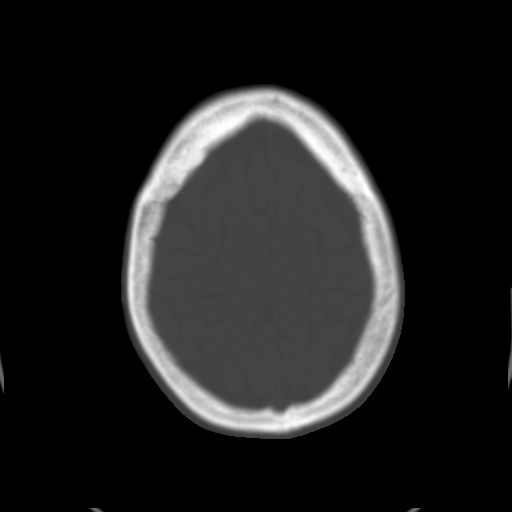
[im 26/32  brain]
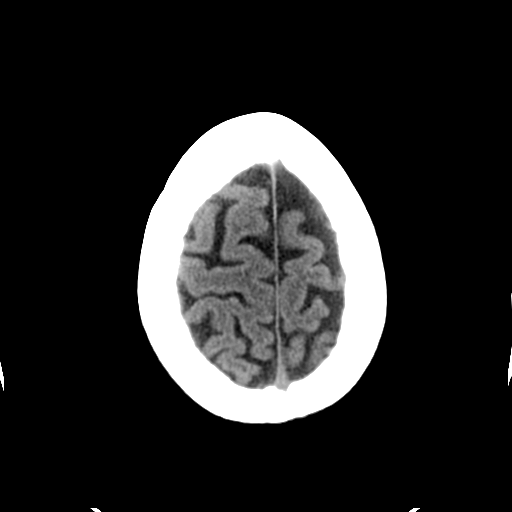
[im 28/32  brain]
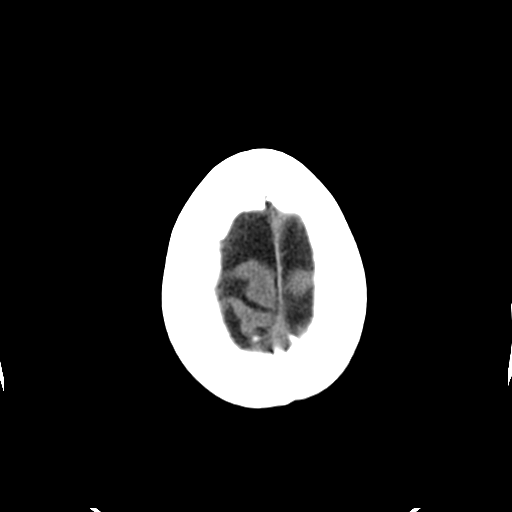
[im 30/32  brain]
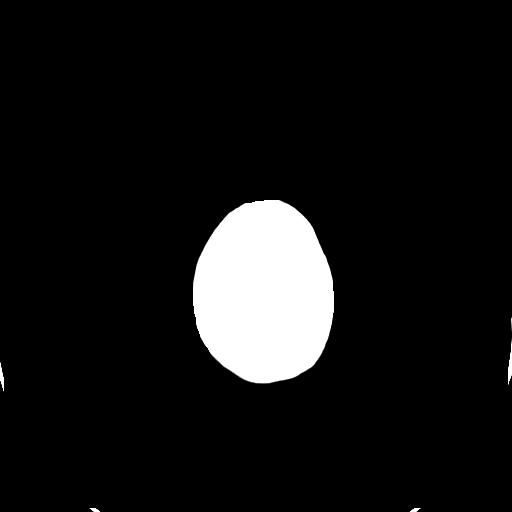

[16 of 30 positions shown; findings below may reference images not displayed]

FINDINGS: The ventricles are normal in size.  There is no
hemorrhage, hydrocephalus, mass effect, mass lesion, or evidence of
acute cortically based infarction.

The visualized paranasal sinuses, mastoid air cells, and middle
ears are clear.  The skull is intact.
IMPRESSION: No acute intracranial abnormality.

## 2015-02-06 NOTE — Op Note (Signed)
PATIENT NAME:  [name], [name] MR#:  [id number] DATE OF BIRTH:  1931-06-16  DATE OF PROCEDURE:  11/20/2011  PREOPERATIVE DIAGNOSIS: Compression fracture of L3 with intractable pain.   POSTOPERATIVE DIAGNOSIS: Compression fracture of L3 with intractable pain.   PROCEDURES:  1. Kyphoplasty of L3 with biopsy and injection of PMMA cement.  2. Radiologic interpretation of fluoroscopic images with kyphoplasty and biopsy of L3.   SURGEON: Consuela Widener C. Jeanene Mena, M.D.   ASSISTANT: None.   ANESTHESIA: MAC.   ESTIMATED BLOOD LOSS: Negligible.  SPECIMENS SENT: Tissue from L3.  BRIEF CLINICAL NOTE AND PATHOLOGY: The patient had persistent back pain unresponsive to conservative treatment. Options, risks, and benefits were discussed. She elected to proceed with kyphoplasty. At the time of the procedure, there was a good bone specimen obtained. The vertebral body filled nicely with bone cement with only slight extravasation.   DESCRIPTION OF PROCEDURE: Preop antibiotics, adequate MAC anesthesia, prone position, all prominences well padded. Routine prepping and draping. Appropriate time out was called. The L3 vertebral body was identified and pedicles appropriately aligned. The vertebral body was instrumented from the right side through the pedicle using fluoroscopic guidance. The biopsy was obtained. The balloon was then inserted, inflated, and filled the vertebral body nicely. The balloon was then removed and cement was injected when it was of the appropriate consistency.      There was only slight extravasation, no significant posterior extrusion. The cannula was cleaned and removed. The incision was closed with Monocryl. A soft sterile dressing was applied. The patient was awakened and taken to the PAC-U having tolerated the procedure well. ____________________________ Alysia Penna. Mauri Pole, MD jcc:slb D: 11/20/2011 13:17:22 ET T: 11/20/2011 13:49:11 ET JOB#: [id number]  cc: Alysia Penna. Mauri Pole, MD,  <Dictator>  Alysia Penna Cherina Dhillon MD ELECTRONICALLY SIGNED 11/27/2011 19:34
